# Patient Record
Sex: Male | Born: 1960 | Race: White | Hispanic: No | Marital: Married | Smoking: Former smoker
Health system: Southern US, Community
[De-identification: ages and names within clinical notes are randomized; demographics above are authoritative.]

## PROBLEM LIST (undated history)

## (undated) DIAGNOSIS — E785 Hyperlipidemia, unspecified: Secondary | ICD-10-CM

## (undated) DIAGNOSIS — R918 Other nonspecific abnormal finding of lung field: Secondary | ICD-10-CM

## (undated) DIAGNOSIS — L719 Rosacea, unspecified: Secondary | ICD-10-CM

## (undated) DIAGNOSIS — J439 Emphysema, unspecified: Secondary | ICD-10-CM

## (undated) DIAGNOSIS — T148XXA Other injury of unspecified body region, initial encounter: Secondary | ICD-10-CM

## (undated) DIAGNOSIS — K37 Unspecified appendicitis: Secondary | ICD-10-CM

## (undated) DIAGNOSIS — N400 Enlarged prostate without lower urinary tract symptoms: Secondary | ICD-10-CM

## (undated) DIAGNOSIS — Z972 Presence of dental prosthetic device (complete) (partial): Secondary | ICD-10-CM

## (undated) DIAGNOSIS — K432 Incisional hernia without obstruction or gangrene: Secondary | ICD-10-CM

## (undated) HISTORY — DX: Other nonspecific abnormal finding of lung field: R91.8

## (undated) HISTORY — DX: Unspecified appendicitis: K37

## (undated) HISTORY — DX: Emphysema, unspecified: J43.9

## (undated) HISTORY — DX: Hyperlipidemia, unspecified: E78.5

## (undated) HISTORY — DX: Other injury of unspecified body region, initial encounter: T14.8XXA

## (undated) HISTORY — PX: APPENDECTOMY: SHX54

## (undated) HISTORY — PX: SHOULDER SURGERY: SHX246

## (undated) HISTORY — DX: Benign prostatic hyperplasia without lower urinary tract symptoms: N40.0

## (undated) HISTORY — PX: CARDIAC CATHETERIZATION: SHX172

## (undated) HISTORY — DX: Incisional hernia without obstruction or gangrene: K43.2

---

## 2006-10-17 ENCOUNTER — Other Ambulatory Visit: Payer: Self-pay

## 2006-10-17 ENCOUNTER — Inpatient Hospital Stay: Payer: Self-pay | Admitting: General Surgery

## 2006-11-07 ENCOUNTER — Ambulatory Visit: Payer: Self-pay | Admitting: General Surgery

## 2006-11-10 ENCOUNTER — Ambulatory Visit: Payer: Self-pay | Admitting: General Surgery

## 2006-11-12 ENCOUNTER — Ambulatory Visit: Payer: Self-pay | Admitting: General Surgery

## 2006-11-13 ENCOUNTER — Ambulatory Visit: Payer: Self-pay | Admitting: General Surgery

## 2006-11-17 ENCOUNTER — Ambulatory Visit: Payer: Self-pay | Admitting: General Surgery

## 2006-11-18 ENCOUNTER — Ambulatory Visit: Payer: Self-pay | Admitting: General Surgery

## 2006-11-24 ENCOUNTER — Ambulatory Visit: Payer: Self-pay | Admitting: General Surgery

## 2006-12-01 ENCOUNTER — Encounter: Payer: Self-pay | Admitting: General Surgery

## 2006-12-16 ENCOUNTER — Encounter: Payer: Self-pay | Admitting: General Surgery

## 2007-01-16 ENCOUNTER — Encounter: Payer: Self-pay | Admitting: General Surgery

## 2007-02-14 ENCOUNTER — Encounter: Payer: Self-pay | Admitting: General Surgery

## 2007-03-17 ENCOUNTER — Encounter: Payer: Self-pay | Admitting: General Surgery

## 2007-03-20 IMAGING — XA DG NEPHRO TUBE REMOVAL / FL
1 series · 2 of 2 positions shown · non-contrast
Comparison: none

REASON FOR EXAM: Abscess drainage, tube removal
COMMENTS:

[Series 1: run · 2 of 2 slices shown]
[im 1/2]
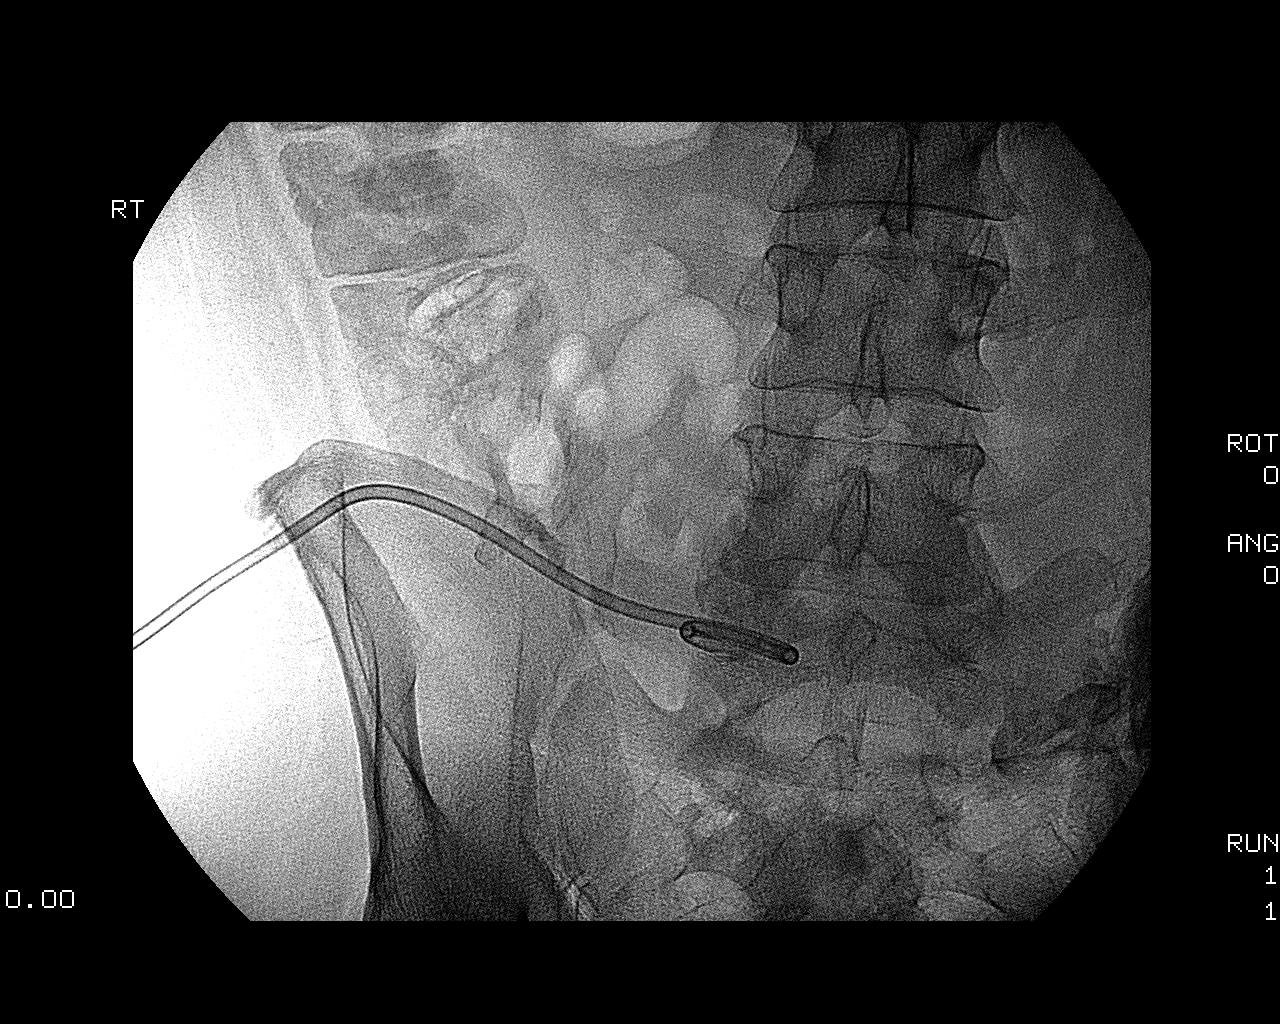
[im 2/2]
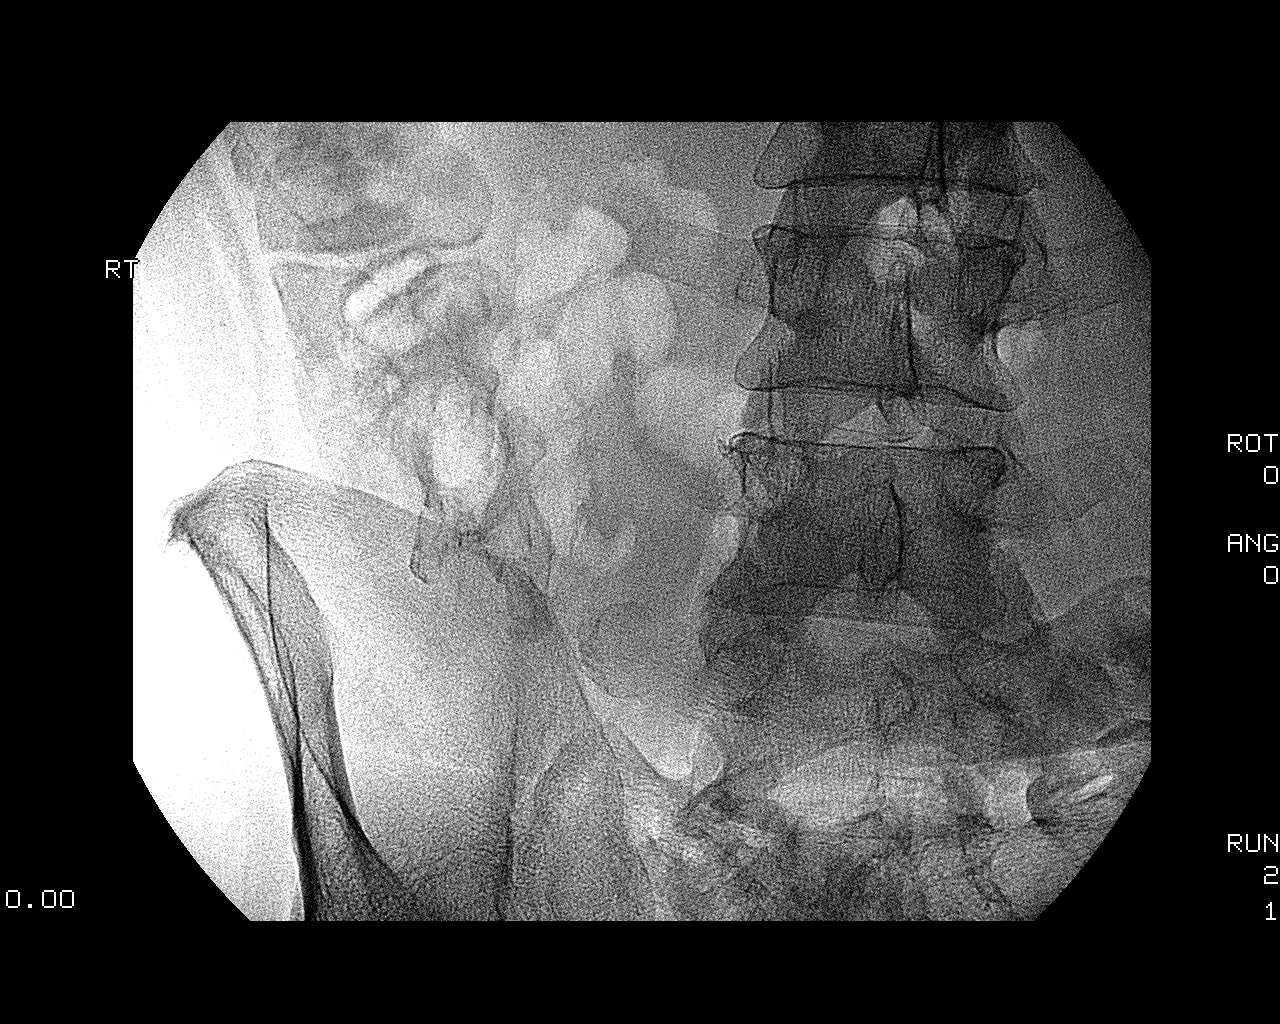

[2 of 2 positions shown; findings below may reference images not displayed]

PROCEDURE:     VAS - TUBE REMOVAL / FLOURO  - November 13, 2006 [DATE]

RESULT:     The patient was brought to the fluoroscopy suite and placed in a
supine position. A RIGHT lower quadrant drainage catheter was appreciated
coped. The entry site for the drainage catheter was prepped and draped in
the usual sterile fashion. The sutures used to secure the catheter were
removed. An Amplatz guidewire was placed through the drainage catheter after
de-coping the catheter tip. The catheter was then removed over the Amplatz
guidewire. The patient tolerated the procedure without complications.
Pressure was held at the entry site without evidence of significant
drainage. The patient was given proper instructions for wound maintenance
and discharged home.
IMPRESSION: Fluoroscopically guided drainage catheter removal as
described above. The patient tolerated the procedure without complications.

## 2007-04-16 ENCOUNTER — Encounter: Payer: Self-pay | Admitting: General Surgery

## 2007-05-17 ENCOUNTER — Encounter: Payer: Self-pay | Admitting: General Surgery

## 2007-05-18 ENCOUNTER — Ambulatory Visit: Payer: Self-pay | Admitting: General Surgery

## 2007-06-16 ENCOUNTER — Encounter: Payer: Self-pay | Admitting: General Surgery

## 2007-12-17 HISTORY — PX: ABDOMINAL HERNIA REPAIR: SHX539

## 2008-06-20 ENCOUNTER — Ambulatory Visit: Payer: Self-pay | Admitting: General Surgery

## 2008-06-28 ENCOUNTER — Other Ambulatory Visit: Payer: Self-pay

## 2008-06-28 ENCOUNTER — Ambulatory Visit: Payer: Self-pay | Admitting: General Surgery

## 2008-06-29 ENCOUNTER — Ambulatory Visit: Payer: Self-pay | Admitting: General Surgery

## 2012-04-20 ENCOUNTER — Ambulatory Visit: Payer: Self-pay

## 2012-04-20 LAB — DOT URINE DIP: Specific Gravity: 1.01 (ref 1.003–1.030)

## 2015-10-03 ENCOUNTER — Telehealth: Payer: Self-pay | Admitting: Gastroenterology

## 2015-10-03 NOTE — Telephone Encounter (Signed)
Colonoscopy triage °

## 2015-10-09 ENCOUNTER — Ambulatory Visit (INDEPENDENT_AMBULATORY_CARE_PROVIDER_SITE_OTHER): Payer: PRIVATE HEALTH INSURANCE | Admitting: Obstetrics and Gynecology

## 2015-10-09 ENCOUNTER — Encounter: Payer: Self-pay | Admitting: *Deleted

## 2015-10-09 VITALS — BP 122/73 | HR 82 | Resp 16 | Ht 71.0 in | Wt 226.8 lb

## 2015-10-09 DIAGNOSIS — R1030 Lower abdominal pain, unspecified: Secondary | ICD-10-CM

## 2015-10-09 DIAGNOSIS — N400 Enlarged prostate without lower urinary tract symptoms: Secondary | ICD-10-CM | POA: Diagnosis not present

## 2015-10-09 LAB — URINALYSIS, COMPLETE
Bilirubin, UA: NEGATIVE
GLUCOSE, UA: NEGATIVE
Ketones, UA: NEGATIVE
Nitrite, UA: NEGATIVE
PROTEIN UA: NEGATIVE
Specific Gravity, UA: >1.03 — ABNORMAL HIGH (ref 1.005–1.030)
Urobilinogen, Ur: 0.2 mg/dL (ref 0.2–1.0)
pH, UA: 5.5 (ref 5.0–7.5)

## 2015-10-09 LAB — BLADDER SCAN AMB NON-IMAGING: Scan Result: 72

## 2015-10-09 LAB — MICROSCOPIC EXAMINATION: RENAL EPITHEL UA: NONE SEEN /HPF

## 2015-10-09 NOTE — Progress Notes (Signed)
10/09/2015 1:49 PM   Cody Matthews 1961-10-18 [id number]  Referring provider: No referring provider defined for this encounter.  Chief Complaint  Patient presents with  . Benign Prostatic Hypertrophy  . Establish Care    HPI: Patient is a 54yo male presenting today with c/o of suprapubic pain x 3 weeks.  He was treated by his PCP with Cipro and tamsulosin. He states that symptoms have improved but not completely resolved. No dysuria, flank pain, gross hematuria or fevers.  History of renal stones. Last episode 6 years ago.  Passed with medical management.  Urinary symptoms include urgency, intermittency, sensation of incomplete bladder emptying, straining to urinate and nocturia twice per night.  09/27/15  PSA 2.84 Cr 1.03  I-PSS- 17 QOL- mixed   PMH: Past Medical History  Diagnosis Date  . Appendicitis   . Hernia, incisional   . Sprain     rotator cuff    Surgical History: Past Surgical History  Procedure Laterality Date  . Appendectomy    . Shoulder surgery Left 2014, 2015  . Abdominal hernia repair  2009    Home Medications:    Medication List       This list is accurate as of: 10/09/15 11:59 PM.  Always use your most recent med list.               ciprofloxacin 500 MG tablet  Commonly known as:  CIPRO  TAKE 1 TABLET BY MOUTH TWICE A DAY FOR 2 WEEKS UNTIL FINISHED     metroNIDAZOLE 0.75 % cream  Commonly known as:  METROCREAM  Apply 1 application topically 2 (two) times daily.     tamsulosin 0.4 MG Caps capsule  Commonly known as:  FLOMAX  Take 0.4 mg by mouth daily.        Allergies:  Allergies  Allergen Reactions  . Codeine Other (See Comments)    HA    Family History: Family History  Problem Relation Age of Onset  . Kidney cancer Neg Hx   . Kidney disease Neg Hx   . Prostate cancer Neg Hx     Social History:  reports that he has been smoking Cigarettes.  He has a 57 pack-year smoking history. He does not have any smokeless  tobacco history on file. He reports that he does not drink alcohol or use illicit drugs.  ROS: UROLOGY Frequent Urination?: Yes Hard to postpone urination?: Yes Burning/pain with urination?: No Get up at night to urinate?: Yes Leakage of urine?: No Urine stream starts and stops?: No Trouble starting stream?: No Do you have to strain to urinate?: No Blood in urine?: No Urinary tract infection?: No Sexually transmitted disease?: No Injury to kidneys or bladder?: No Painful intercourse?: No Weak stream?: No Erection problems?: Yes Penile pain?: No  Gastrointestinal Nausea?: No Vomiting?: No Indigestion/heartburn?: No Diarrhea?: No Constipation?: No  Constitutional Fever: No Night sweats?: No Weight loss?: No Fatigue?: No  Skin Skin rash/lesions?: No Itching?: No  Eyes Blurred vision?: No Double vision?: No  Ears/Nose/Throat Sore throat?: No Sinus problems?: No  Hematologic/Lymphatic Swollen glands?: No Easy bruising?: No  Cardiovascular Leg swelling?: No Chest pain?: No  Respiratory Cough?: No Shortness of breath?: No  Endocrine Excessive thirst?: No  Musculoskeletal Back pain?: No Joint pain?: No  Neurological Headaches?: No Dizziness?: No  Psychologic Depression?: No Anxiety?: No  Physical Exam: BP 122/73 mmHg  Pulse 82  Resp 16  Ht 5\' 11"  (1.803 m)  Wt 226 lb 12.8 oz (102.876  kg)  BMI 31.65 kg/m2  Constitutional:  Alert and oriented, No acute distress. HEENT: [place] AT, moist mucus membranes.  Trachea midline, no masses. Cardiovascular: No clubbing, cyanosis, or edema. Respiratory: Normal respiratory effort, no increased work of breathing. GI: Abdomen is soft, nontender, nondistended, no abdominal masses GU: No CVA tenderness, uncircumcised phallus, testicles descended bilaterally DRE: prostate normal, nontender, no nodules Skin: No rashes, bruises or suspicious lesions. Lymph: No cervical or inguinal adenopathy. Neurologic: Grossly  intact, no focal deficits, moving all 4 extremities. Psychiatric: Normal mood and affect.  Laboratory Data:   Urinalysis  Pertinent Imaging:  Assessment & Plan:    1. BPH (benign prostatic hyperplasia)-  PVR 27ml. UA unremarkable.  Patient would like to continue Cipro/ Flomax and follow up if symptoms do not resolved. - Urinalysis, Complete  2. H/O Renal Calculi- we discussed that possibly recurrent renal stones could be a source of patient's suprapubic pain. He declined any further workup at this time and states he'll will call to schedule a follow-up appointment if his pain does not resolve.  Return if symptoms worsen or fail to improve.  These notes generated with voice recognition software. I apologize for typographical errors.  Herbert Moors, [facility] [address], [place], [place] [address] [phone]

## 2015-10-13 ENCOUNTER — Telehealth: Payer: Self-pay

## 2015-10-13 ENCOUNTER — Other Ambulatory Visit: Payer: Self-pay

## 2015-10-13 ENCOUNTER — Encounter: Payer: Self-pay | Admitting: *Deleted

## 2015-10-13 NOTE — Telephone Encounter (Signed)
Pt scheduled at [facility] on 10/19/15. Instructs/rx mailed.

## 2015-10-13 NOTE — Telephone Encounter (Signed)
Gastroenterology Pre-Procedure Review  Request Date: 10/19/15 Requesting Physician:   PATIENT REVIEW QUESTIONS: The patient responded to the following health history questions as indicated:    1. Are you having any GI issues? no 2. Do you have a personal history of Polyps? no 3. Do you have a family history of Colon Cancer or Polyps? no 4. Diabetes Mellitus? no 5. Joint replacements in the past 12 months?no 6. Major health problems in the past 3 months?no 7. Any artificial heart valves, MVP, or defibrillator?no    MEDICATIONS & ALLERGIES:    Patient reports the following regarding taking any anticoagulation/antiplatelet therapy:   Plavix, Coumadin, Eliquis, Xarelto, Lovenox, Pradaxa, Brilinta, or Effient? no Aspirin? no  Patient confirms/reports the following medications:  Current Outpatient Prescriptions  Medication Sig Dispense Refill  . ciprofloxacin (CIPRO) 500 MG tablet TAKE 1 TABLET BY MOUTH TWICE A DAY FOR 2 WEEKS UNTIL FINISHED  0  . metroNIDAZOLE (METROCREAM) 0.75 % cream Apply 1 application topically 2 (two) times daily.    . tamsulosin (FLOMAX) 0.4 MG CAPS capsule Take 0.4 mg by mouth daily.  5   No current facility-administered medications for this visit.    Patient confirms/reports the following allergies:  Allergies  Allergen Reactions  . Codeine Other (See Comments)    HA    No orders of the defined types were placed in this encounter.    AUTHORIZATION INFORMATION Primary Insurance: 1D#: Group #:  Secondary Insurance: 1D#: Group #:  SCHEDULE INFORMATION: Date: 10/19/15 Time: Location: [place]

## 2015-10-16 ENCOUNTER — Encounter: Payer: Self-pay | Admitting: Anesthesiology

## 2015-10-17 NOTE — Discharge Instructions (Signed)

## 2015-10-18 ENCOUNTER — Other Ambulatory Visit: Payer: Self-pay

## 2015-10-19 ENCOUNTER — Encounter
Admission: RE | Disposition: A | Discharge status: Home / Self Care | Payer: Self-pay | Source: Ambulatory Visit | Attending: Gastroenterology

## 2015-10-19 ENCOUNTER — Ambulatory Visit: Payer: PRIVATE HEALTH INSURANCE | Admitting: Anesthesiology

## 2015-10-19 ENCOUNTER — Other Ambulatory Visit: Payer: Self-pay | Admitting: Gastroenterology

## 2015-10-19 ENCOUNTER — Ambulatory Visit
Admission: RE | Admit: 2015-10-19 | Discharge: 2015-10-19 | Disposition: A | Discharge status: Home / Self Care | Payer: PRIVATE HEALTH INSURANCE | Source: Ambulatory Visit | Attending: Gastroenterology | Admitting: Gastroenterology

## 2015-10-19 DIAGNOSIS — Z880 Allergy status to penicillin: Secondary | ICD-10-CM | POA: Diagnosis not present

## 2015-10-19 DIAGNOSIS — D124 Benign neoplasm of descending colon: Secondary | ICD-10-CM | POA: Insufficient documentation

## 2015-10-19 DIAGNOSIS — D125 Benign neoplasm of sigmoid colon: Secondary | ICD-10-CM | POA: Insufficient documentation

## 2015-10-19 DIAGNOSIS — F17210 Nicotine dependence, cigarettes, uncomplicated: Secondary | ICD-10-CM | POA: Diagnosis not present

## 2015-10-19 DIAGNOSIS — Z885 Allergy status to narcotic agent status: Secondary | ICD-10-CM | POA: Insufficient documentation

## 2015-10-19 DIAGNOSIS — Z79899 Other long term (current) drug therapy: Secondary | ICD-10-CM | POA: Insufficient documentation

## 2015-10-19 DIAGNOSIS — L719 Rosacea, unspecified: Secondary | ICD-10-CM | POA: Diagnosis not present

## 2015-10-19 DIAGNOSIS — Z1211 Encounter for screening for malignant neoplasm of colon: Secondary | ICD-10-CM | POA: Insufficient documentation

## 2015-10-19 HISTORY — PX: COLONOSCOPY WITH PROPOFOL: SHX5780

## 2015-10-19 HISTORY — DX: Presence of dental prosthetic device (complete) (partial): Z97.2

## 2015-10-19 HISTORY — DX: Rosacea, unspecified: L71.9

## 2015-10-19 HISTORY — PX: POLYPECTOMY: SHX5525

## 2015-10-19 SURGERY — COLONOSCOPY WITH PROPOFOL
Anesthesia: Monitor Anesthesia Care | Wound class: Contaminated

## 2015-10-19 MED ORDER — OXYCODONE HCL 5 MG/5ML PO SOLN: 5.0000 mg | Freq: Once | ORAL | Status: DC | PRN

## 2015-10-19 MED ORDER — PROMETHAZINE HCL 25 MG/ML IJ SOLN: 6.2500 mg | INTRAMUSCULAR | Status: DC | PRN

## 2015-10-19 MED ORDER — SODIUM CHLORIDE 0.9 % IV SOLN: INTRAVENOUS | Status: DC

## 2015-10-19 MED ORDER — LACTATED RINGERS IV SOLN
INTRAVENOUS | Status: DC
  Administered 2015-10-19: 09:00:00 via INTRAVENOUS

## 2015-10-19 MED ORDER — OXYCODONE HCL 5 MG PO TABS: 5.0000 mg | ORAL_TABLET | Freq: Once | ORAL | Status: DC | PRN

## 2015-10-19 MED ORDER — PROPOFOL 10 MG/ML IV BOLUS
INTRAVENOUS | Status: DC | PRN
  Administered 2015-10-19: 100 mg via INTRAVENOUS

## 2015-10-19 MED ORDER — LIDOCAINE HCL (CARDIAC) 20 MG/ML IV SOLN
INTRAVENOUS | Status: DC | PRN
  Administered 2015-10-19: 50 mg via INTRAVENOUS

## 2015-10-19 MED ORDER — STERILE WATER FOR IRRIGATION IR SOLN
Status: DC | PRN
  Administered 2015-10-19: 10:00:00

## 2015-10-19 MED ORDER — HYDROMORPHONE HCL 1 MG/ML IJ SOLN: 0.2500 mg | INTRAMUSCULAR | Status: DC | PRN

## 2015-10-19 MED ORDER — MEPERIDINE HCL 25 MG/ML IJ SOLN: 6.2500 mg | INTRAMUSCULAR | Status: DC | PRN

## 2015-10-19 SURGICAL SUPPLY — 28 items

## 2015-10-19 NOTE — Anesthesia Procedure Notes (Signed)
Procedure Name: MAC Performed by: Jataya Wann Pre-anesthesia Checklist: Patient identified, Emergency Drugs available, Suction available, Timeout performed and Patient being monitored Patient Re-evaluated:Patient Re-evaluated prior to inductionOxygen Delivery Method: Nasal cannula Placement Confirmation: positive ETCO2     

## 2015-10-19 NOTE — Anesthesia Postprocedure Evaluation (Signed)
  Anesthesia Post-op Note  Patient: Cody Matthews  Procedure(s) Performed: Procedure(s): COLONOSCOPY WITH PROPOFOL (N/A)  Anesthesia type:MAC  Patient location: PACU  Post pain: Pain level controlled  Post assessment: Post-op Vital signs reviewed, Patient's Cardiovascular Status Stable, Respiratory Function Stable, Patent Airway and No signs of Nausea or vomiting  Post vital signs: Reviewed and stable  Last Vitals:  Filed Vitals:   10/19/15 1020  BP:   Pulse: 73  Temp: 36.6 C  Resp: 17    Level of consciousness: awake, alert  and patient cooperative  Complications: No apparent anesthesia complications

## 2015-10-19 NOTE — Transfer of Care (Signed)
Immediate Anesthesia Transfer of Care Note  Patient: Cody Matthews  Procedure(s) Performed: Procedure(s): COLONOSCOPY WITH PROPOFOL (N/A)  Patient Location: PACU  Anesthesia Type: MAC  Level of Consciousness: awake, alert  and patient cooperative  Airway and Oxygen Therapy: Patient Spontanous Breathing and Patient connected to supplemental oxygen  Post-op Assessment: Post-op Vital signs reviewed, Patient's Cardiovascular Status Stable, Respiratory Function Stable, Patent Airway and No signs of Nausea or vomiting  Post-op Vital Signs: Reviewed and stable  Complications: No apparent anesthesia complications

## 2015-10-19 NOTE — Op Note (Signed)
[facility] Gastroenterology Patient Name: Cody Matthews Procedure Date: 10/19/2015 9:59 AM MRN: [id number] Account #: [id number] Date of Birth: 06-19-1961 Admit Type: Outpatient Age: 54 Room: [facility] OR ROOM 01 Gender: Male Note Status: Finalized Procedure:         Colonoscopy Indications:       Screening for colorectal malignant neoplasm Providers:         Lucilla Lame, MD Referring MD:      Cletis Athens, MD (Referring MD) Medicines:         Propofol per Anesthesia Complications:     No immediate complications. Procedure:         Pre-Anesthesia Assessment:                    - Prior to the procedure, a History and Physical was                     performed, and patient medications and allergies were                     reviewed. The patient's tolerance of previous anesthesia                     was also reviewed. The risks and benefits of the procedure                     and the sedation options and risks were discussed with the                     patient. All questions were answered, and informed consent                     was obtained. Prior Anticoagulants: The patient has taken                     no previous anticoagulant or antiplatelet agents. ASA                     Grade Assessment: II - A patient with mild systemic                     disease. After reviewing the risks and benefits, the                     patient was deemed in satisfactory condition to undergo                     the procedure.                    After obtaining informed consent, the colonoscope was                     passed under direct vision. Throughout the procedure, the                     patient's blood pressure, pulse, and oxygen saturations                     were monitored continuously. The was introduced through                     the anus and advanced to the the cecum, identified by  appendiceal orifice and ileocecal valve. The colonoscopy          was performed without difficulty. The patient tolerated                     the procedure well. The quality of the bowel preparation                     was excellent. Findings:      The perianal and digital rectal examinations were normal.      Three sessile polyps were found in the descending colon. The polyps were       4 to 6 mm in size. These polyps were removed with a cold snare.       Resection and retrieval were complete.      Three sessile polyps were found in the sigmoid colon. The polyps were 2       to 3 mm in size. These polyps were removed with a cold biopsy forceps.       Resection and retrieval were complete. Impression:        - Three 4 to 6 mm polyps in the descending colon. Resected                     and retrieved.                    - Three 2 to 3 mm polyps in the sigmoid colon. Resected                     and retrieved. Recommendation:    - Await pathology results.                    - Repeat colonoscopy in 5 years if polyp adenoma and 10                     years if hyperplastic Procedure Code(s): --- Professional ---                    [phone], Colonoscopy, flexible; with removal of tumor(s),                     polyp(s), or other lesion(s) by snare technique                    45380, 36, Colonoscopy, flexible; with biopsy, single or                     multiple Diagnosis Code(s): --- Professional ---                    Z12.11, Encounter for screening for malignant neoplasm of                     colon                    D12.4, Benign neoplasm of descending colon                    D12.5, Benign neoplasm of sigmoid colon CPT copyright 2014 American Medical Association. All rights reserved. The codes documented in this report are preliminary and upon coder review may  be revised to meet current compliance requirements. Lucilla Lame, MD 10/19/2015 10:16:42 AM This report has been signed electronically. Number of Addenda: 0 Note Initiated On: 10/19/2015  9:59 AM Scope Withdrawal Time: 0 hours 6 minutes 24 seconds  Total Procedure Duration: 0 hours 11 minutes 8 seconds       [facility]

## 2015-10-19 NOTE — Anesthesia Preprocedure Evaluation (Signed)
Anesthesia Evaluation  Patient identified by MRN, date of birth, ID band Patient awake    Reviewed: Allergy & Precautions, NPO status , Patient's Chart, lab work & pertinent test results, reviewed documented beta blocker date and time   Airway Mallampati: II  TM Distance: >3 FB Neck ROM: Full    Dental  (+) Upper Dentures, Lower Dentures   Pulmonary Current Smoker,           Cardiovascular negative cardio ROS       Neuro/Psych negative neurological ROS  negative psych ROS   GI/Hepatic negative GI ROS, Neg liver ROS,   Endo/Other  negative endocrine ROS  Renal/GU negative Renal ROS  negative genitourinary   Musculoskeletal negative musculoskeletal ROS (+)   Abdominal   Peds negative pediatric ROS (+)  Hematology negative hematology ROS (+)   Anesthesia Other Findings   Reproductive/Obstetrics negative OB ROS                             Anesthesia Physical Anesthesia Plan  ASA: II  Anesthesia Plan: MAC   Post-op Pain Management:    Induction:   Airway Management Planned:   Additional Equipment:   Intra-op Plan:   Post-operative Plan:   Informed Consent: I have reviewed the patients History and Physical, chart, labs and discussed the procedure including the risks, benefits and alternatives for the proposed anesthesia with the patient or authorized representative who has indicated his/her understanding and acceptance.     Plan Discussed with: CRNA  Anesthesia Plan Comments:         Anesthesia Quick Evaluation

## 2015-10-19 NOTE — H&P (Signed)
  [facility]  [address] [place], [place] [address] Phone: [phone] Fax : [phone]  Primary Care Physician:  Cletis Athens, MD Primary Gastroenterologist:  Dr. Allen Norris  Pre-Procedure History & Physical: HPI:  [name] is a 54 y.o. male is here for a screening colonoscopy.   Past Medical History  Diagnosis Date  . Appendicitis   . Hernia, incisional   . Sprain     rotator cuff  . Rosacea   . Wears dentures     full upper and lower    Past Surgical History  Procedure Laterality Date  . Appendectomy    . Shoulder surgery Left 2014, 2015  . Abdominal hernia repair  2009  . Cardiac catheterization  approx 2000    no issues    Prior to Admission medications   Medication Sig Start Date End Date Taking? Authorizing Provider  metroNIDAZOLE (METROCREAM) 0.75 % cream Apply 1 application topically 2 (two) times daily.   Yes Historical Provider, MD  tamsulosin (FLOMAX) 0.4 MG CAPS capsule Take 0.4 mg by mouth daily. 09/22/15  Yes Historical Provider, MD  ciprofloxacin (CIPRO) 500 MG tablet TAKE 1 TABLET BY MOUTH TWICE A DAY FOR 2 WEEKS UNTIL FINISHED 09/22/15   Historical Provider, MD  metroNIDAZOLE (METROGEL) 1 % gel APPLY TO AFFECTED AREA TWICE A DAY 10/02/15   Historical Provider, MD    Allergies as of 10/13/2015 - Review Complete 10/13/2015  Allergen Reaction Noted  . Codeine Other (See Comments) 10/09/2015  . Penicillins Other (See Comments) 10/13/2015    Family History  Problem Relation Age of Onset  . Kidney cancer Neg Hx   . Kidney disease Neg Hx   . Prostate cancer Neg Hx     Social History   Social History  . Marital Status: Married    Spouse Name: N/A  . Number of Children: N/A  . Years of Education: N/A   Occupational History  . Not on file.   Social History Main Topics  . Smoking status: Current Every Day Smoker -- 1.50 packs/day for 38 years    Types: Cigarettes  . Smokeless tobacco: Not on file  . Alcohol Use: No    . Drug Use: No  . Sexual Activity: Not on file   Other Topics Concern  . Not on file   Social History Narrative    Review of Systems: See HPI, otherwise negative ROS  Physical Exam: BP 118/73 mmHg  Pulse 71  Temp(Src) 98.2 F (36.8 C)  Ht 5\' 11"  (1.803 m)  Wt 218 lb (98.884 kg)  BMI 30.42 kg/m2  SpO2 94% General:   Alert,  pleasant and cooperative in NAD Head:  Normocephalic and atraumatic. Neck:  Supple; no masses or thyromegaly. Lungs:  Clear throughout to auscultation.    Heart:  Regular rate and rhythm. Abdomen:  Soft, nontender and nondistended. Normal bowel sounds, without guarding, and without rebound.   Neurologic:  Alert and  oriented x4;  grossly normal neurologically.  Impression/Plan: Cody Matthews is now here to undergo a screening colonoscopy.  Risks, benefits, and alternatives regarding colonoscopy have been reviewed with the patient.  Questions have been answered.  All parties agreeable.

## 2015-10-20 ENCOUNTER — Encounter: Payer: Self-pay | Admitting: Gastroenterology

## 2015-10-21 ENCOUNTER — Encounter: Payer: Self-pay | Admitting: Gastroenterology

## 2016-07-23 ENCOUNTER — Other Ambulatory Visit: Payer: Self-pay

## 2016-07-30 ENCOUNTER — Telehealth: Payer: Self-pay | Admitting: *Deleted

## 2016-07-30 NOTE — Telephone Encounter (Signed)
Received referral for low dose lung cancer screening CT scan. Voicemail left at phone number listed in EMR for patient to call me back to facilitate scheduling scan.  

## 2016-07-30 NOTE — Telephone Encounter (Signed)
Received referral for initial lung cancer screening scan. Contacted patient and obtained smoking history,(current 57pack year) as well as answering questions related to screening process. Patient denies signs of lung cancer such as weight loss or hemoptysis. Patient denies comorbidity that would prevent curative treatment if lung cancer were found. Patient is tentatively scheduled for shared decision making visit and CT scan on 08/06/16 at 1:30pm, pending insurance approval from business office.

## 2016-08-02 ENCOUNTER — Other Ambulatory Visit: Payer: Self-pay | Admitting: *Deleted

## 2016-08-02 DIAGNOSIS — Z87891 Personal history of nicotine dependence: Secondary | ICD-10-CM

## 2016-08-06 ENCOUNTER — Inpatient Hospital Stay: Payer: PRIVATE HEALTH INSURANCE | Attending: Oncology | Admitting: Oncology

## 2016-08-06 ENCOUNTER — Ambulatory Visit
Admission: RE | Admit: 2016-08-06 | Discharge: 2016-08-06 | Disposition: A | Discharge status: Home / Self Care | Payer: No Typology Code available for payment source | Source: Ambulatory Visit | Attending: Oncology | Admitting: Oncology

## 2016-08-06 ENCOUNTER — Encounter (INDEPENDENT_AMBULATORY_CARE_PROVIDER_SITE_OTHER): Payer: Self-pay

## 2016-08-06 DIAGNOSIS — R918 Other nonspecific abnormal finding of lung field: Secondary | ICD-10-CM | POA: Diagnosis not present

## 2016-08-06 DIAGNOSIS — Z87891 Personal history of nicotine dependence: Secondary | ICD-10-CM | POA: Insufficient documentation

## 2016-08-06 DIAGNOSIS — Z122 Encounter for screening for malignant neoplasm of respiratory organs: Secondary | ICD-10-CM | POA: Diagnosis not present

## 2016-08-06 DIAGNOSIS — I2510 Atherosclerotic heart disease of native coronary artery without angina pectoris: Secondary | ICD-10-CM | POA: Diagnosis not present

## 2016-08-08 ENCOUNTER — Telehealth: Payer: Self-pay | Admitting: *Deleted

## 2016-08-08 NOTE — Telephone Encounter (Signed)
Attempted to notify patient of LDCT lung cancer screening results with recommendation for 6 month follow up imaging. Also will be notified of incidental finding noted below, and instructed to discuss with referring provider to assess for needed further evaluation or intervention. Voicemail left for patient. This note will be forwarded to referring provider.  IMPRESSION: 1. Lung-Rads category 3S, probably benign findings. Short-term follow-up in 6 months is recommended with repeat low-dose chest CT without contrast (please use the following order, "CT CHEST LCS NODULE FOLLOW-UP W/O CM"). A right lower lobe nodular density is subpleural position and could represent a area of volume loss/ atelectasis. 2. The "S" modifier represents a potentially clinically significant non pulmonary finding. Age advanced coronary artery atherosclerosis, including calcified plaque in the left main. Correlate with risk factors, consider medical therapy, and possibly cardiology consultation.

## 2016-08-12 DIAGNOSIS — Z87891 Personal history of nicotine dependence: Secondary | ICD-10-CM | POA: Insufficient documentation

## 2016-08-12 NOTE — Progress Notes (Signed)
In accordance with CMS guidelines, patient has met eligibility criteria including age, absence of signs or symptoms of lung cancer.  Social History  Substance Use Topics  . Smoking status: Current Every Day Smoker    Packs/day: 1.50    Years: 38.00    Types: Cigarettes  . Smokeless tobacco: Not on file  . Alcohol use No     A shared decision-making session was conducted prior to the performance of CT scan. This includes one or more decision aids, includes benefits and harms of screening, follow-up diagnostic testing, over-diagnosis, false positive rate, and total radiation exposure.  Counseling on the importance of adherence to annual lung cancer LDCT screening, impact of co-morbidities, and ability or willingness to undergo diagnosis and treatment is imperative for compliance of the program.  Counseling on the importance of continued smoking cessation for former smokers; the importance of smoking cessation for current smokers, and information about tobacco cessation interventions have been given to patient including [place] and [facility] quit [place] programs.  Written order for lung cancer screening with LDCT has been given to the patient and any and all questions have been answered to the best of my abilities.   Yearly follow up will be coordinated by Burgess Estelle, Thoracic Navigator.

## 2016-11-05 ENCOUNTER — Ambulatory Visit: Payer: No Typology Code available for payment source | Admitting: Cardiology

## 2016-11-13 ENCOUNTER — Encounter: Payer: Self-pay | Admitting: Internal Medicine

## 2016-11-13 ENCOUNTER — Encounter (INDEPENDENT_AMBULATORY_CARE_PROVIDER_SITE_OTHER): Payer: Self-pay

## 2016-11-13 ENCOUNTER — Ambulatory Visit (INDEPENDENT_AMBULATORY_CARE_PROVIDER_SITE_OTHER): Payer: 59 | Admitting: Internal Medicine

## 2016-11-13 VITALS — BP 124/72 | HR 61 | Ht 69.5 in | Wt 239.5 lb

## 2016-11-13 DIAGNOSIS — I208 Other forms of angina pectoris: Secondary | ICD-10-CM

## 2016-11-13 DIAGNOSIS — R0602 Shortness of breath: Secondary | ICD-10-CM | POA: Diagnosis not present

## 2016-11-13 MED ORDER — ISOSORBIDE MONONITRATE ER 30 MG PO TB24: 30.0000 mg | ORAL_TABLET | Freq: Every day | ORAL | 3 refills | Status: DC

## 2016-11-13 MED ORDER — NITROGLYCERIN 0.4 MG SL SUBL: 0.4000 mg | SUBLINGUAL_TABLET | SUBLINGUAL | 5 refills | Status: DC | PRN

## 2016-11-13 NOTE — Patient Instructions (Addendum)
Medication Instructions:  Your physician has recommended you make the following change in your medication:  1- START Imdur 30 mg (1 tablet) by mouth once a day. 2- Nitroglycerin 0.4 mg (1 tablet) by mouth every 5 minutes as needed, up to 3 times.   The proper use and anticipated side effects of nitroglycerine has been carefully explained.  If a single episode of chest pain is not relieved by one tablet, the patient will try another within 5 minutes; and for a 3rd time after 5 minutes and if this doesn't relieve the pain, the patient is instructed to call 911 for transportation to an emergency department.   Follow-Up: Your physician recommends that you schedule a follow-up appointment in: 3 MONTHS WITH DR END.  We will request medical records from Dr [facility] office for Dr End to review.

## 2016-11-13 NOTE — Progress Notes (Signed)
New Outpatient Visit Date: 11/13/2016  Referring Provider: Cletis Athens, MD [address] [place], [place] [address]  Chief Complaint: Chest discomfort and abnormal stress test  HPI:  Cody Matthews is a 55 y.o. year-old male with history of COPD, tobacco use, and BPH, who has been referred by Dr. Lavera Guise for evaluation of chest discomfort, shortness of breath, and abnormal stress test. The patient has noticed a vague tightness across his chest with a maximal intensity of 2/10 over the last 4 months. This occurs only when he walks up an incline and resolves promptly within 5 minutes. He notes accompanying shortness of breath but no other symptoms. He has not had any pain or dyspnea at rest. He reports occasional cramping in both legs, beginning in the calves and radiating up to the thighs. This typically happens when he has been sitting still for long time or lying in bed. He denies lower extremity edema as well as orthopnea and PND.  The patient was recently evaluated by Dr. Lavera Guise, who reportedly performed an echocardiogram and myocardial perfusion stress test. The patient states that an abnormality was identified on the stress test, though the records are not available for review today. Cody Matthews reports that cardiac catheterization was recommended.  --------------------------------------------------------------------------------------------------  Cardiovascular History & Procedures: Cardiovascular Problems:  Stable angina with abnormal stress test  Risk Factors:  Tobacco use, male gender, and age > 72  Cath/PCI:  None  CV Surgery:  None  EP Procedures and Devices:  None  Non-Invasive Evaluation(s):  TTE: Reportedly normal per the patient.  Stress test: Reportedly abnormal per patient, but images and report are not available for review.  Recent CV Pertinent Labs: Not  available.  --------------------------------------------------------------------------------------------------  Past Medical History:  Diagnosis Date  . Appendicitis   . BPH (benign prostatic hyperplasia)   . Emphysema lung ([facility])   . Hernia, incisional   . Hyperlipidemia   . Lung nodules   . Rosacea   . Sprain    rotator cuff  . Wears dentures    full upper and lower    Past Surgical History:  Procedure Laterality Date  . ABDOMINAL HERNIA REPAIR  2009  . APPENDECTOMY    . CARDIAC CATHETERIZATION  approx 2000   no issues  . COLONOSCOPY WITH PROPOFOL N/A 10/19/2015   Procedure: COLONOSCOPY WITH PROPOFOL;  Surgeon: Lucilla Lame, MD;  Location: [facility];  Service: Endoscopy;  Laterality: N/A;  . POLYPECTOMY  10/19/2015   Procedure: POLYPECTOMY;  Surgeon: Lucilla Lame, MD;  Location: [facility];  Service: Endoscopy;;  . SHOULDER SURGERY Left 2014, 2015    Outpatient Encounter Prescriptions as of 11/13/2016  Medication Sig  . aspirin 81 MG tablet Take 81 mg by mouth daily.  Marland Kitchen atorvastatin (LIPITOR) 80 MG tablet Take by mouth daily at 6 PM.   . buPROPion (WELLBUTRIN XL) 150 MG 24 hr tablet Take 150 mg by mouth.   . metoprolol succinate (TOPROL-XL) 100 MG 24 hr tablet Take 100 mg by mouth.   . metroNIDAZOLE (METROCREAM) 0.75 % cream Apply 1 application topically 2 (two) times daily.  . metroNIDAZOLE (METROGEL) 1 % gel APPLY TO AFFECTED AREA TWICE A DAY  . tamsulosin (FLOMAX) 0.4 MG CAPS capsule Take 0.4 mg by mouth daily.  . [DISCONTINUED] ciprofloxacin (CIPRO) 500 MG tablet TAKE 1 TABLET BY MOUTH TWICE A DAY FOR 2 WEEKS UNTIL FINISHED   No facility-administered encounter medications on file as of 11/13/2016.     Allergies: Codeine and Penicillins  Social History   Social History  . Marital status: Married    Spouse name: N/A  . Number of children: N/A  . Years of education: N/A   Occupational History  . Not on file.   Social History Main Topics  .  Smoking status: Current Every Day Smoker    Packs/day: 1.00    Years: 38.00    Types: Cigarettes  . Smokeless tobacco: Never Used  . Alcohol use 0.0 oz/week     Comment: Drinks 1 beer every few weeks to month  . Drug use: No  . Sexual activity: Not on file   Other Topics Concern  . Not on file   Social History Narrative  . No narrative on file    Family History  Problem Relation Age of Onset  . Breast cancer Mother   . Emphysema Father   . Heart disease Father   . Heart attack Maternal Grandfather 62  . Stroke Paternal Grandmother   . Heart disease Paternal Grandfather     Pacemaker  . Kidney cancer Neg Hx   . Kidney disease Neg Hx   . Prostate cancer Neg Hx     Review of Systems: A 12-system review of systems was performed and was negative except as noted in the HPI.  --------------------------------------------------------------------------------------------------  Physical Exam: BP 124/72 (BP Location: Right Arm, Patient Position: Sitting, Cuff Size: Normal)   Pulse 61   Ht 5' 9.5" (1.765 m)   Wt 239 lb 8 oz (108.6 kg)   BMI 34.86 kg/m   General:  Obese man seated comfortably in the exam room. He smells of tobacco smoke. HEENT: No conjunctival pallor or scleral icterus.  Moist mucous membranes.  OP clear. Neck: Supple without lymphadenopathy, thyromegaly, JVD, or HJR. Lungs: Normal work of breathing.  Clear to auscultation bilaterally without wheezes or crackles. Heart: Regular rate and rhythm without murmurs, rubs, or gallops.  Non-displaced PMI. Abd: Bowel sounds present.  Soft, NT/ND without hepatosplenomegaly Ext: No lower extremity edema.  Radial, PT, and DP pulses are 2+ bilaterally Skin: warm and dry without rash Neuro: CNIII-XII intact.  Strength and fine-touch sensation intact in upper and lower extremities bilaterally. Psych: Normal mood and affect.  EKG:  Normal sinus rhythm with 1st degree AV block (PR interval 218 ms). No significant change from  prior tracing on 06/28/08 (I have personally reviewed both tracings).  --------------------------------------------------------------------------------------------------  ASSESSMENT AND PLAN: Stable angina The patient's exertional chest tightness and shortness of breath are consistent with stable angina. Unfortunately, his outside records are not available for review today. We have agreed to continue with his current doses of aspirin, metoprolol, and atorvastatin. We will add isosorbide mononitrate 30 mg daily to see if this helps improve his symptoms. We will request records from Dr. Jennette Kettle office. If there is evidence of intermediate/high risk ischemia or if the patient's symptoms do not resolve with antianginal therapy, we will proceed with cardiac catheterization.  Shortness of breath This is likely multifactorial. I have encouraged the patient to continue cutting back on his tobacco use and ultimately quitting.  Follow-up: Return to clinic in 3 months.  Nelva Bush, MD 11/13/2016 3:17 PM

## 2016-11-19 ENCOUNTER — Telehealth: Payer: Self-pay | Admitting: Internal Medicine

## 2016-11-19 DIAGNOSIS — I208 Other forms of angina pectoris: Secondary | ICD-10-CM

## 2016-11-19 NOTE — Telephone Encounter (Signed)
I have reviewed the outside records from Dr. Lavera Guise.  Stress test on 10/28/16 was notable for exercise duration of 8 minutes with peak heart rate of 114 bpm.  No significant ST segment changes were noted.  There are conflicting reports of chest pain during the study.  Since our visit last week with addition of isosorbide mononitrate, the patient has not had any further episodes of exertional chest pain.  However, given his symptoms prior to initiation of medical therapy and cardiovascular risk factors in the setting of a non-diagnostic ETT, we have agreed to proceed with a pharmacologic myocardial perfusion stress test for further risk stratification.  Nelva Bush, MD [facility] Pager: [phone]

## 2016-11-22 ENCOUNTER — Telehealth: Payer: Self-pay | Admitting: *Deleted

## 2016-11-22 DIAGNOSIS — R079 Chest pain, unspecified: Secondary | ICD-10-CM

## 2016-11-22 NOTE — Telephone Encounter (Signed)
Pt returning our call, states best time is in afternoon 2-4 pm  Please call back

## 2016-11-22 NOTE — Telephone Encounter (Signed)
No answer. Left message to call back to schedule nuclear stress test.

## 2016-11-25 NOTE — Telephone Encounter (Signed)
No answer. Left message to call back on home and cell phone.

## 2016-11-26 NOTE — Telephone Encounter (Signed)
Cell phone number rang but no answering machine picked up. Will try again later.

## 2016-11-27 ENCOUNTER — Encounter: Payer: Self-pay | Admitting: *Deleted

## 2016-11-27 NOTE — Telephone Encounter (Signed)
S/w patient. Patient agreeable to nuclear myoview. Scheduled patient for 12/23/16 at 0730am. Instructions and Information given to patient as follows.  Letter with information mailed to patient. Patient also wrote down the instructions over the phone.  [place]  Your caregiver has ordered a Stress Test with nuclear imaging. The purpose of this test is to evaluate the blood supply to your heart muscle. This procedure is referred to as a "Non-Invasive Stress Test." This is because other than having an IV started in your vein, nothing is inserted or "invades" your body. Cardiac stress tests are done to find areas of poor blood flow to the heart by determining the extent of coronary artery disease (CAD). Some patients exercise on a treadmill, which naturally increases the blood flow to your heart, while others who are  unable to walk on a treadmill due to physical limitations have a pharmacologic/chemical stress agent called Lexiscan . This medicine will mimic walking on a treadmill by temporarily increasing your coronary blood flow.   Please note: these test may take anywhere between 2-4 hours to complete  PLEASE REPORT TO [facility] AT THE FIRST DESK WILL DIRECT YOU WHERE TO GO  Date of Procedure:____1/8/18_________  Arrival Time for Procedure:______07:15  Instructions regarding medication:   _yes__:  Hold Metoprolol night before procedure and morning of procedure.  PLEASE NOTIFY THE OFFICE AT LEAST 91 HOURS IN ADVANCE IF YOU ARE UNABLE TO KEEP YOUR APPOINTMENT.  [phone] AND  PLEASE NOTIFY NUCLEAR MEDICINE AT [facility] AT LEAST 24 HOURS IN ADVANCE IF YOU ARE UNABLE TO KEEP YOUR APPOINTMENT. [phone]  How to prepare for your Myoview test:  1. Do not eat or drink after midnight 2. No caffeine for 24 hours prior to test 3. No smoking 24 hours prior to test. 4. Your medication may be taken with water.  If your doctor stopped a medication because of this  test, do not take that medication. 5. Ladies, please do not wear dresses.  Skirts or pants are appropriate. Please wear a short sleeve shirt. 6. No perfume, cologne or lotion. 7. Wear comfortable walking shoes. No heels!

## 2016-12-18 ENCOUNTER — Telehealth: Payer: Self-pay | Admitting: Internal Medicine

## 2016-12-18 MED ORDER — ISOSORBIDE MONONITRATE ER 30 MG PO TB24: 30.0000 mg | ORAL_TABLET | Freq: Every day | ORAL | 3 refills | Status: DC

## 2016-12-18 NOTE — Telephone Encounter (Signed)
S/w patient. He said his insurance company called and they won't pay for his Myoview or office visit in November with Dr End. Patient started saying how his insurance company won't cover his medications anymore at [facility] unless they are from [place]. Patient said he already ran out of his Imdur and is just going to take his other medications until the run out and then stop. He is going to stop because there are too many hoops to jump through and 3-way calls with the doctor and all and it would be easier just to stop taking his medications. Advised patient that we need to figure out a plan with his medications as it could put him at risk of stroke/heart attack, etc, if he stops his medications. Patient said its just too hard to get them from [facility] but his insurance will cover his medications from there. I let patient know all I have to do is send in a new Rx to [facility] as his insurance desires. I sent Rx for isosorbide to [facility] as Dr End prescribed. I advised patient to call Dr Jennette Kettle office who prescribed him his other medications today to refill those to [facility] as well.  We decided we would start here and he will call back if he still has a problem with getting his medications. Patient verbalized understanding.  Sent message to pre-certification/billing pool to inquire about myoview and insurance.

## 2016-12-18 NOTE — Telephone Encounter (Signed)
Charmaine M Julaine Hua, RN        We will check to see if prior Josem Kaufmann is required and if so we will obtain auth. Dr. Saunders Revel ordered for medical necessity. Not elective. Pt will need to call his insurance and state OUTPT not office as [employer] owns the equipment and it is really a satellite office when they are in the testing area. He can give them the CPT code [phone] and they can give him his benefits, responsible amount based on his individual plan. It is pt's responsibility to call insurance company

## 2016-12-18 NOTE — Telephone Encounter (Signed)
Pt calling stating he can't do the myoview on Monday Stating his insurance company won't pay for it

## 2016-12-19 NOTE — Telephone Encounter (Signed)
No answer. Left message to call back on cell phone.

## 2016-12-19 NOTE — Telephone Encounter (Signed)
Patient returned call.  Gave him the information he needed to call his insurance company. He verbalized understanding and will call. Let him know I will call him once I find out if the prior auth is complete for his myoview.

## 2016-12-20 NOTE — Telephone Encounter (Signed)
Received message from [employer] in Billing that [place] precertification with insurance was approved.  He verbalized understanding of information and instructions of myoview on 12/23/16 and to [facility]15am [facility].

## 2016-12-23 ENCOUNTER — Encounter
Admission: RE | Admit: 2016-12-23 | Discharge: 2016-12-23 | Disposition: A | Discharge status: Home / Self Care | Payer: 59 | Source: Ambulatory Visit | Attending: Internal Medicine | Admitting: Internal Medicine

## 2016-12-23 DIAGNOSIS — R079 Chest pain, unspecified: Secondary | ICD-10-CM | POA: Diagnosis not present

## 2016-12-23 LAB — NM MYOCAR MULTI W/SPECT W/WALL MOTION / EF
CHL CUP MPHR: 164 {beats}/min
CHL CUP NUCLEAR SDS: 0
CHL CUP NUCLEAR SSS: 0
CSEPHR: 50 %
CSEPPHR: 82 {beats}/min
Estimated workload: 1 METS
Exercise duration (min): 0 min
Exercise duration (sec): 0 s
LV dias vol: 106 mL (ref 62–150)
LVSYSVOL: 35 mL
Rest BP: 13067 mmHg
Rest HR: 57 {beats}/min
SRS: 0
TID: 1.1

## 2016-12-23 MED ORDER — REGADENOSON 0.4 MG/5ML IV SOLN
0.4000 mg | Freq: Once | INTRAVENOUS | Status: AC
  Administered 2016-12-23: 0.4 mg via INTRAVENOUS

## 2016-12-23 MED ORDER — TECHNETIUM TC 99M TETROFOSMIN IV KIT
29.7650 | PACK | Freq: Once | INTRAVENOUS | Status: AC | PRN
  Administered 2016-12-23: 29.765 via INTRAVENOUS

## 2016-12-23 MED ORDER — TECHNETIUM TC 99M TETROFOSMIN IV KIT
13.0000 | PACK | Freq: Once | INTRAVENOUS | Status: AC | PRN
  Administered 2016-12-23: 13.63 via INTRAVENOUS

## 2017-01-15 ENCOUNTER — Telehealth: Payer: Self-pay | Admitting: *Deleted

## 2017-01-15 DIAGNOSIS — R9389 Abnormal findings on diagnostic imaging of other specified body structures: Secondary | ICD-10-CM

## 2017-01-15 NOTE — Telephone Encounter (Signed)
Left voicemail for patient notifyng them that it is time to schedule low dose lung cancer screening follow up CT scan. Instructed patient to call back to verify information prior to the scan being scheduled.  

## 2017-01-15 NOTE — Telephone Encounter (Signed)
Notified patient that lung cancer screening low dose CT scan is due. Confirmed that patient is within the age range of 55-77, and asymptomatic, (no signs or symptoms of lung cancer). Patient denies illness that would prevent curative treatment for lung cancer if found. The patient is a current smoker, with a 57 pack year history. The shared decision making visit was done 08/06/16. Patient is agreeable for CT scan being scheduled.

## 2017-02-10 ENCOUNTER — Ambulatory Visit
Admission: RE | Admit: 2017-02-10 | Discharge: 2017-02-10 | Disposition: A | Discharge status: Home / Self Care | Payer: 59 | Source: Ambulatory Visit | Attending: Oncology | Admitting: Oncology

## 2017-02-10 DIAGNOSIS — I2510 Atherosclerotic heart disease of native coronary artery without angina pectoris: Secondary | ICD-10-CM | POA: Diagnosis not present

## 2017-02-10 DIAGNOSIS — J439 Emphysema, unspecified: Secondary | ICD-10-CM | POA: Diagnosis not present

## 2017-02-10 DIAGNOSIS — I700 Atherosclerosis of aorta: Secondary | ICD-10-CM | POA: Insufficient documentation

## 2017-02-10 DIAGNOSIS — R938 Abnormal findings on diagnostic imaging of other specified body structures: Secondary | ICD-10-CM | POA: Diagnosis present

## 2017-02-10 DIAGNOSIS — R918 Other nonspecific abnormal finding of lung field: Secondary | ICD-10-CM | POA: Diagnosis not present

## 2017-02-10 DIAGNOSIS — R9389 Abnormal findings on diagnostic imaging of other specified body structures: Secondary | ICD-10-CM

## 2017-02-12 ENCOUNTER — Ambulatory Visit: Payer: 59 | Admitting: Internal Medicine

## 2017-02-14 ENCOUNTER — Telehealth: Payer: Self-pay | Admitting: *Deleted

## 2017-02-14 NOTE — Telephone Encounter (Signed)
Notified patient of LDCT lung cancer screening results with recommendation for 12 month follow up imaging. Also notified of incidental finding noted below which patient is encouraged to discuss with PCP, who will be forwarded this note via Epic. Patient verbalizes understanding.   IMPRESSION: 1. Lung-RADS Category 2, benign appearance or behavior. Continue annual screening with low-dose chest CT without contrast in 12 months. 2. Aortic atherosclerosis (ICD10-170.0). Three-vessel coronary artery calcification. 3.  Emphysema (ICD10-J43.9).

## 2017-03-04 ENCOUNTER — Encounter: Payer: Self-pay | Admitting: Internal Medicine

## 2017-03-04 ENCOUNTER — Ambulatory Visit (INDEPENDENT_AMBULATORY_CARE_PROVIDER_SITE_OTHER): Payer: 59 | Admitting: Internal Medicine

## 2017-03-04 VITALS — BP 124/72 | HR 58 | Ht 70.0 in | Wt 241.0 lb

## 2017-03-04 DIAGNOSIS — I2584 Coronary atherosclerosis due to calcified coronary lesion: Secondary | ICD-10-CM

## 2017-03-04 DIAGNOSIS — R0789 Other chest pain: Secondary | ICD-10-CM

## 2017-03-04 DIAGNOSIS — Z720 Tobacco use: Secondary | ICD-10-CM

## 2017-03-04 DIAGNOSIS — E785 Hyperlipidemia, unspecified: Secondary | ICD-10-CM | POA: Diagnosis not present

## 2017-03-04 DIAGNOSIS — I2510 Atherosclerotic heart disease of native coronary artery without angina pectoris: Secondary | ICD-10-CM | POA: Insufficient documentation

## 2017-03-04 NOTE — Progress Notes (Signed)
Follow-up Outpatient Visit Date: 03/04/2017  Primary Care Provider: Cletis Athens, [place] [address]  Chief Complaint: Follow-up chest pain  HPI:  Cody Matthews is a 56 y.o. year-old male with history of COPD, tobacco use, and BPH, who presents for follow-up of chest pain. I first met him in 10/2016. At that time, he reported vague chest discomfort when when walking up an incline. Stress test by Dr. Lavera Guise was reportedly abnormal, though review of the records indicated that target heart rate was never achieved. We therefore agreed to perform a pharmacologic myocardial perfusion stress test, which showed no evidence of ischemia. Today, Cody Matthews reports feeling well. He notes sporadic, sharp substernal chest pain about once every 1 weeks. The pain lasts for 3-5 seconds; the patient describes it as feeling "like something needs to bust through." There are no associated symptoms. He has not had any exertional chest pain or shortness of breath. He also denies palpitations, lightheadedness, orthopnea, PND, edema, and claudication. He is trying to quit smoking but is awaiting insurance authorization for Chantix.  --------------------------------------------------------------------------------------------------  Cardiovascular History & Procedures: Cardiovascular Problems:  Stable angina with abnormal stress test  Risk Factors:  Tobacco use, male gender, and age > 19  Cath/PCI:  None  CV Surgery:  None  EP Procedures and Devices:  None  Non-Invasive Evaluation(s):  Pharmacologic myocardial perfusion stress test (12/23/16): Low risk study. There is a small in size, mild in severity, fixed defect at the apex, most likely due to apical thinning. LVEF normal.  TTE: Reportedly normal per the patient.  Stress test (10/28/16): Exercise duration of 8 minutes with peak HR 114 bpm. No significant ST segment changes (conflicting reports of chest pain during the study).:    Recent CV Pertinent Labs: Not available. Past medical and surgical history were reviewed and updated in EPIC.  Outpatient Encounter Prescriptions as of 03/04/2017  Medication Sig  . aspirin 81 MG tablet Take 81 mg by mouth daily.  Marland Kitchen atorvastatin (LIPITOR) 80 MG tablet Take by mouth daily at 6 PM.   . isosorbide mononitrate (IMDUR) 30 MG 24 hr tablet Take 1 tablet (30 mg total) by mouth daily.  . metoprolol succinate (TOPROL-XL) 100 MG 24 hr tablet Take 100 mg by mouth.   . metroNIDAZOLE (METROCREAM) 0.75 % cream Apply 1 application topically 2 (two) times daily.  . metroNIDAZOLE (METROGEL) 1 % gel APPLY TO AFFECTED AREA TWICE A DAY  . nitroGLYCERIN (NITROSTAT) 0.4 MG SL tablet Place 1 tablet (0.4 mg total) under the tongue every 5 (five) minutes as needed for chest pain.  . tamsulosin (FLOMAX) 0.4 MG CAPS capsule Take 0.4 mg by mouth daily.  . [DISCONTINUED] buPROPion (WELLBUTRIN XL) 150 MG 24 hr tablet Take 150 mg by mouth.    No facility-administered encounter medications on file as of 03/04/2017.     Allergies: Codeine and Penicillins  Social History   Social History  . Marital status: Married    Spouse name: N/A  . Number of children: N/A  . Years of education: N/A   Occupational History  . Not on file.   Social History Main Topics  . Smoking status: Current Every Day Smoker    Packs/day: 1.00    Years: 38.00    Types: Cigarettes  . Smokeless tobacco: Never Used  . Alcohol use 0.0 oz/week     Comment: Drinks 1 beer every few weeks to month  . Drug use: No  . Sexual activity: Not on file  Other Topics Concern  . Not on file   Social History Narrative  . No narrative on file    Family History  Problem Relation Age of Onset  . Breast cancer Mother   . Emphysema Father   . Heart disease Father   . Heart attack Maternal Grandfather 62  . Stroke Paternal Grandmother   . Heart disease Paternal Grandfather     Pacemaker  . Kidney cancer Neg Hx   . Kidney  disease Neg Hx   . Prostate cancer Neg Hx     Review of Systems: A 12-system review of systems was performed and was negative except as noted in the HPI.  --------------------------------------------------------------------------------------------------  Physical Exam: BP 124/72 (BP Location: Left Arm, Patient Position: Sitting, Cuff Size: Normal)   Pulse (!) 58   Ht '5\' 10"'  (1.778 m)   Wt 241 lb (109.3 kg)   BMI 34.58 kg/m   General:  Obese man, seated comfortably in the exam room. HEENT: No conjunctival pallor or scleral icterus.  Moist mucous membranes.  OP clear. Neck: Supple without lymphadenopathy, thyromegaly, JVD, or HJR. Lungs: Normal work of breathing.  Clear to auscultation bilaterally without wheezes or crackles. Heart: Regular rate and rhythm without murmurs, rubs, or gallops.  Non-displaced PMI. Abd: Bowel sounds present.  Soft, NT/ND without hepatosplenomegaly Ext: Trace pretibial edema.  Radial, PT, and DP pulses are 2+ bilaterally. Skin: warm and dry without rash  EKG:  Sinus bradycardia with 1st degree AV block. No other significant abnormalities. Heart rate has decreased slightly from prior tracing on 11/13/16. Otherwise, there has been no significant interval change (I have personally reviewed both tracings).  Outside labs (10/30/16): CBC: WBC 8.1, HGB 16.7, HCT 47.3, PLT 245  Na 140, K 4.1, Cl 98, CO2 24, BUN 11, creatinine 0.93, glucose 97, Ca 9.6  Total cholesterol 149, triglycerides 291, HDL 23, LDL 68  TSH 1.960  --------------------------------------------------------------------------------------------------  ASSESSMENT AND PLAN: Atypical chest pain with coronary artery calcification Vague exertional chest pain described by the patient at his last visit has resolved. He continues to have brief, sharp chest pain that is non-exertional and may represent a transient arrhythmia (such as a PAC or PVC) or a non-cardiac etiology. Though significant coronary  artery calcium is evident on his recent lung cancer screening chest CT (I have personally reviewed the images), his myocardial perfusion stress test is reassuring without evidence of ischemia. We will continue with medical therapy. If his pain worsens, Cody Matthews was advised to contact us so that we can proceed with cardiac catheterization.  Dyslipidemia LDL at target, though triglycerides and HDL are still abnormal. We will continue with atorvastatin. I have also recommended lifestyle modification, including exercise.  Tobacco abuse Smoking cessation counseling provided. The patient is eager to try Chantix, as he has had some success with this in the past. I advised him to follow-up with Dr. Jennette Kettle office regarding insurance authorization for this medication.  Follow-up: Return to clinic in 6 months.  Nelva Bush, MD 03/04/2017 9:52 PM

## 2017-03-04 NOTE — Patient Instructions (Signed)

## 2017-05-28 ENCOUNTER — Ambulatory Visit
Admission: RE | Admit: 2017-05-28 | Discharge: 2017-05-28 | Disposition: A | Discharge status: Home / Self Care | Payer: 59 | Source: Ambulatory Visit | Attending: Internal Medicine | Admitting: Internal Medicine

## 2017-05-28 ENCOUNTER — Other Ambulatory Visit: Payer: Self-pay | Admitting: Internal Medicine

## 2017-05-28 DIAGNOSIS — M25512 Pain in left shoulder: Secondary | ICD-10-CM | POA: Insufficient documentation

## 2017-05-28 DIAGNOSIS — M25511 Pain in right shoulder: Secondary | ICD-10-CM | POA: Diagnosis present

## 2017-05-28 DIAGNOSIS — M19011 Primary osteoarthritis, right shoulder: Secondary | ICD-10-CM | POA: Diagnosis not present

## 2017-05-28 DIAGNOSIS — M546 Pain in thoracic spine: Secondary | ICD-10-CM | POA: Diagnosis present

## 2017-05-28 DIAGNOSIS — R52 Pain, unspecified: Secondary | ICD-10-CM

## 2017-05-28 DIAGNOSIS — M5134 Other intervertebral disc degeneration, thoracic region: Secondary | ICD-10-CM | POA: Diagnosis not present

## 2017-07-03 ENCOUNTER — Other Ambulatory Visit: Payer: Self-pay | Admitting: Internal Medicine

## 2017-07-03 DIAGNOSIS — M4802 Spinal stenosis, cervical region: Secondary | ICD-10-CM

## 2017-07-03 DIAGNOSIS — M47812 Spondylosis without myelopathy or radiculopathy, cervical region: Secondary | ICD-10-CM

## 2017-07-10 ENCOUNTER — Ambulatory Visit: Payer: 59

## 2017-07-18 ENCOUNTER — Ambulatory Visit
Admission: RE | Admit: 2017-07-18 | Discharge: 2017-07-18 | Disposition: A | Discharge status: Home / Self Care | Payer: 59 | Source: Ambulatory Visit | Attending: Internal Medicine | Admitting: Internal Medicine

## 2017-07-18 DIAGNOSIS — M4692 Unspecified inflammatory spondylopathy, cervical region: Secondary | ICD-10-CM | POA: Insufficient documentation

## 2017-07-18 DIAGNOSIS — M4802 Spinal stenosis, cervical region: Secondary | ICD-10-CM | POA: Diagnosis not present

## 2017-07-18 DIAGNOSIS — M47892 Other spondylosis, cervical region: Secondary | ICD-10-CM | POA: Insufficient documentation

## 2017-07-18 DIAGNOSIS — M47812 Spondylosis without myelopathy or radiculopathy, cervical region: Secondary | ICD-10-CM

## 2017-08-27 DIAGNOSIS — M542 Cervicalgia: Secondary | ICD-10-CM | POA: Insufficient documentation

## 2017-09-01 ENCOUNTER — Ambulatory Visit: Payer: 59 | Admitting: Physical Therapy

## 2017-09-03 ENCOUNTER — Ambulatory Visit: Payer: 59 | Attending: Neurology | Admitting: Physical Therapy

## 2017-09-03 DIAGNOSIS — M542 Cervicalgia: Secondary | ICD-10-CM

## 2017-09-03 DIAGNOSIS — M6281 Muscle weakness (generalized): Secondary | ICD-10-CM

## 2017-09-03 DIAGNOSIS — M2560 Stiffness of unspecified joint, not elsewhere classified: Secondary | ICD-10-CM

## 2017-09-03 DIAGNOSIS — M5412 Radiculopathy, cervical region: Secondary | ICD-10-CM

## 2017-09-07 ENCOUNTER — Encounter: Payer: Self-pay | Admitting: Physical Therapy

## 2017-09-07 NOTE — Therapy (Addendum)
[facility] [address] [place], [place], [address] Phone: [phone]   Fax:  [phone]  Physical Therapy Evaluation  Patient Details  Name: Cody Matthews MRN: [id number] Date of Birth: Feb 13, 1961 Referring Provider: Dr. Melrose Nakayama  Encounter Date: 09/03/2017      PT End of Session - 09/07/17 2042    Visit Number 1   Number of Visits 8   Date for PT Re-Evaluation 10/01/17   PT Start Time 6237   PT Stop Time 1623   PT Time Calculation (min) 53 min   Activity Tolerance Patient tolerated treatment well;Patient limited by pain       Past Medical History:  Diagnosis Date  . Appendicitis   . BPH (benign prostatic hyperplasia)   . Emphysema lung ([place])   . Hernia, incisional   . Hyperlipidemia   . Lung nodules   . Rosacea   . Sprain    rotator cuff  . Wears dentures    full upper and lower    Past Surgical History:  Procedure Laterality Date  . ABDOMINAL HERNIA REPAIR  2009  . APPENDECTOMY    . CARDIAC CATHETERIZATION  approx 2000   no issues  . COLONOSCOPY WITH PROPOFOL N/A 10/19/2015   Procedure: COLONOSCOPY WITH PROPOFOL;  Surgeon: Lucilla Lame, MD;  Location: [place];  Service: Endoscopy;  Laterality: N/A;  . POLYPECTOMY  10/19/2015   Procedure: POLYPECTOMY;  Surgeon: Lucilla Lame, MD;  Location: [place];  Service: Endoscopy;;  . SHOULDER SURGERY Left 2014, 2015    There were no vitals filed for this visit.    Pt. 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  Lifts between 2 to 50# on a regular basis.      Manual tx:  Supine cervical traction 3x with static holds.   L/R UT and levator stretches 2x each.  Cervical AA/PROM all planes as tolerated in supine/ seated position.  See HEP.          Pt. is a pleasant 56 y/o with > 3 months neck/ mid-scapular pain with work-related tasks.  Pt. reports 3/10 neck pain  at rest and worsening symptoms for 1-2 days after completing lifting/ yardwork.  Pt. has increase neck pain in supine/ prone positioning.  Moderate generalized cervical hypomobility with no radicular symptoms noted.  Cervical AROM:  flexion 40 deg./ extension 38 deg./ R rotn. 56 deg./ L rotn. 50 deg./ R lat. flexion 29 deg./ L lat. flexion 35 deg.  No change in symptoms with cervical traction/ compression. B shoulder AROM WFL.  B UE muscle strength grossly 5/5 MMT.  L sh. discomfort with overhead reaching/ lifting tasks.  NDI: 48%.  Pt. will benefit from skilled PT services to increase cervical AROM/ pain-free mobility to promote return to work.            PT Long Term Goals - 09/14/17 2134      PT LONG TERM GOAL #1   Title Pt. I with HEP to increase cervical AROM to [facility] to improve pain-free mobility/ promote return to work.     Baseline Moderate generalized cervical hypomobility with no radicular symptoms noted.  Cervical AROM:  flexion 40 deg./ extension 38 deg./ R rotn. 56 deg./ L rotn. 50 deg./ R lat. flexion 29 deg./ L lat. flexion 35 deg.    Time 4   Period Weeks   Status New  Target Date 10/01/17     PT LONG TERM GOAL #2   Title Pt. will demonstrate proper lifting/ B carrying techniques (up to 50#) to promote return to work.     Baseline OOW for 3 months secondary to neck pain.     Time 4   Period Weeks   Status New   Target Date 10/01/17     PT LONG TERM GOAL #3   Title Pt. will decrease NDI to <30% to improve neck pain with daily tasks.     Baseline NDI: 48% on 9/19   Time 4   Period Weeks   Status New   Target Date 10/01/17              Patient will benefit from skilled therapeutic intervention in order to improve the following deficits and impairments:  Improper body mechanics, Pain, Postural dysfunction, Hypomobility, Decreased strength, Decreased range of motion, Decreased endurance, Decreased activity tolerance, Decreased mobility, Impaired  flexibility  Visit Diagnosis: Neck pain  Radiculopathy, cervical region  Joint stiffness  Muscle weakness (generalized)     Problem List Patient Active Problem List   Diagnosis Date Noted  . Atypical chest pain 03/04/2017  . Coronary artery calcification 03/04/2017  . Dyslipidemia 03/04/2017  . Tobacco abuse 03/04/2017  . Personal history of tobacco use, presenting hazards to health 08/12/2016  . Special screening for malignant neoplasms, colon   . Benign neoplasm of descending colon   . Benign neoplasm of sigmoid colon    Pura Spice, PT, DPT # [id number] 09/14/2017, 9:39 PM  [facility] [address] [place], [place], [address] Phone: [phone]   Fax:  [phone]  Name: Cody Matthews MRN: [id number] Date of Birth: 06-Nov-1961

## 2017-09-10 ENCOUNTER — Ambulatory Visit: Payer: 59 | Admitting: Physical Therapy

## 2017-09-10 DIAGNOSIS — M542 Cervicalgia: Secondary | ICD-10-CM | POA: Diagnosis not present

## 2017-09-10 DIAGNOSIS — M2560 Stiffness of unspecified joint, not elsewhere classified: Secondary | ICD-10-CM

## 2017-09-10 DIAGNOSIS — M5412 Radiculopathy, cervical region: Secondary | ICD-10-CM

## 2017-09-10 DIAGNOSIS — M6281 Muscle weakness (generalized): Secondary | ICD-10-CM

## 2017-09-12 NOTE — Therapy (Addendum)
[facility] [address] [place], [place], [address] Phone: [phone]   Fax:  [phone]  Physical Therapy Treatment  Patient Details  Name: Cody Matthews MRN: [id number] Date of Birth: 1961-02-13 Referring Provider: Dr. Melrose Nakayama  Encounter Date: 09/10/2017      PT End of Session - 09/11/17 1349    Visit Number 2   Number of Visits 8   Date for PT Re-Evaluation 10/01/17   PT Start Time 4332   PT Stop Time 1501   PT Time Calculation (min) 48 min   Activity Tolerance Patient tolerated treatment well;Patient limited by pain   Behavior During Therapy [facility] for tasks assessed/performed      Past Medical History:  Diagnosis Date  . Appendicitis   . BPH (benign prostatic hyperplasia)   . Emphysema lung ([place])   . Hernia, incisional   . Hyperlipidemia   . Lung nodules   . Rosacea   . Sprain    rotator cuff  . Wears dentures    full upper and lower    Past Surgical History:  Procedure Laterality Date  . ABDOMINAL HERNIA REPAIR  2009  . APPENDECTOMY    . CARDIAC CATHETERIZATION  approx 2000   no issues  . COLONOSCOPY WITH PROPOFOL N/A 10/19/2015   Procedure: COLONOSCOPY WITH PROPOFOL;  Surgeon: Lucilla Lame, MD;  Location: [place];  Service: Endoscopy;  Laterality: N/A;  . POLYPECTOMY  10/19/2015   Procedure: POLYPECTOMY;  Surgeon: Lucilla Lame, MD;  Location: [place];  Service: Endoscopy;;  . SHOULDER SURGERY Left 2014, 2015    There were no vitals filed for this visit.      Subjective Assessment - 09/11/17 1348    Limitations House hold activities;Lifting   Patient Stated Goals Decrease neck pain   Currently in Pain? Yes      Pt. reports no new issues.  Neck pain remains 2-3/10 (low/mid region) currently at rest.  Pt. states he has difficulty with standing pec. stretch secondary to sh. pain.           There.ex.:  Reviewed HEP.  Supine B shoulder flexion/ horizontal abd. (pec stretches)  as tolerated with static holds.  Cervical isometrics 5x with manual feedback (rotn./ chin tucks)- pain tolerable.  Standing scap. Retraction with feedback.    Manual tx:  Supine cervical traction 3x with holds (as tolerated).  STM to cervical paraspinals/ UT region/ posterior deltoid in supine position.  L/R UT and levator stretches 4x each.  Cervical rotn. L/R 5x each (as tolerated).  Prone PA grade II-III Mobs. To cervical/ mid-thoracic region (central/ unilateral).  STM to mid-scapular region with MH.  Discuss ice vs. Heat.         No increase cervical pain with supine c-spine isometrics (rotn./ extension) 5x each.  Moderate cervical/thoracic spinal mobility in prone position with no radicular symptoms.  Progress HEP to more postural/ strengthening ex. to promote pt. back to work.           Plan - 09/11/17 1350    Clinical Presentation Stable   Clinical Decision Making Moderate   Rehab Potential Good   PT Frequency 2x / week   PT Duration 4 weeks   PT Treatment/Interventions ADLs/Self Care Home Management;Cryotherapy;Moist Heat;Therapeutic exercise;Therapeutic activities;Functional mobility training;Neuromuscular re-education;Patient/family education;Passive range of motion;Manual techniques;Dry needling   PT Next Visit Plan Progress HEP/ strengthening ex.    PT Home Exercise Plan see handouts   Consulted and Agree with Plan  of Care Patient      Patient will benefit from skilled therapeutic intervention in order to improve the following deficits and impairments:  Improper body mechanics, Pain, Postural dysfunction, Hypomobility, Decreased strength, Decreased range of motion, Decreased endurance, Decreased activity tolerance, Decreased mobility, Impaired flexibility  Visit Diagnosis: Neck pain  Radiculopathy, cervical region  Muscle weakness (generalized)  Joint stiffness     Problem List Patient Active Problem List   Diagnosis Date Noted  . Atypical chest pain  03/04/2017  . Coronary artery calcification 03/04/2017  . Dyslipidemia 03/04/2017  . Tobacco abuse 03/04/2017  . Personal history of tobacco use, presenting hazards to health 08/12/2016  . Special screening for malignant neoplasms, colon   . Benign neoplasm of descending colon   . Benign neoplasm of sigmoid colon    Pura Spice, PT, DPT # [id number] 09/12/2017, 1:53 PM  [place] [facility] [address] [place], [place], [address] Phone: [phone]   Fax:  [phone]  Name: Cody Matthews MRN: [id number] Date of Birth: 10/03/1961

## 2017-09-14 NOTE — Addendum Note (Signed)
Addended by: Pura Spice on: 09/14/2017 09:45 PM   Modules accepted: Orders

## 2017-09-15 ENCOUNTER — Ambulatory Visit: Payer: 59 | Attending: Neurology

## 2017-09-15 ENCOUNTER — Other Ambulatory Visit: Payer: Self-pay | Admitting: Internal Medicine

## 2017-09-15 DIAGNOSIS — M542 Cervicalgia: Secondary | ICD-10-CM | POA: Diagnosis present

## 2017-09-15 DIAGNOSIS — M6281 Muscle weakness (generalized): Secondary | ICD-10-CM | POA: Diagnosis present

## 2017-09-15 DIAGNOSIS — M2560 Stiffness of unspecified joint, not elsewhere classified: Secondary | ICD-10-CM | POA: Insufficient documentation

## 2017-09-15 DIAGNOSIS — M5412 Radiculopathy, cervical region: Secondary | ICD-10-CM | POA: Diagnosis present

## 2017-09-15 NOTE — Therapy (Signed)
[facility] [facility] [address] [place], [place], [address] Phone: [phone]   Fax:  [phone]  Physical Therapy Treatment  Patient Details  Name: Cody Matthews MRN: [id number] Date of Birth: 10-Sep-1961 Referring Provider: Dr. Melrose Nakayama  Encounter Date: 09/15/2017      PT End of Session - 09/15/17 1026    Visit Number 3   Number of Visits 8   Date for PT Re-Evaluation 10/01/17   PT Start Time 1030   PT Stop Time 1115   PT Time Calculation (min) 45 min   Activity Tolerance Patient tolerated treatment well   Behavior During Therapy [facility] for tasks assessed/performed      Past Medical History:  Diagnosis Date  . Appendicitis   . BPH (benign prostatic hyperplasia)   . Emphysema lung ([place])   . Hernia, incisional   . Hyperlipidemia   . Lung nodules   . Rosacea   . Sprain    rotator cuff  . Wears dentures    full upper and lower    Past Surgical History:  Procedure Laterality Date  . ABDOMINAL HERNIA REPAIR  2009  . APPENDECTOMY    . CARDIAC CATHETERIZATION  approx 2000   no issues  . COLONOSCOPY WITH PROPOFOL N/A 10/19/2015   Procedure: COLONOSCOPY WITH PROPOFOL;  Surgeon: Lucilla Lame, MD;  Location: [place];  Service: Endoscopy;  Laterality: N/A;  . POLYPECTOMY  10/19/2015   Procedure: POLYPECTOMY;  Surgeon: Lucilla Lame, MD;  Location: [place];  Service: Endoscopy;;  . SHOULDER SURGERY Left 2014, 2015    There were no vitals filed for this visit.      Subjective Assessment - 09/15/17 1026    Subjective Pt reports approximately 2/10 central ache/sharp pain in his neck. Believes that he is doing slighlty better today compared to prior session. HEP is going well. No specific questions or concerns on this date. He returns to see his MD 09/24/17.   Pertinent History Pt. is currently scheduled to be out of work until MD f/u on 09/24/17.   Pt. is L handed.  See MRI report.  Pt. has previous L shoulder  surgeries (2x).   Pt. states neck pain started around 04/2017.  Pt. will take 2 days to recover after completing yard work.  Tylenol does not help.  Pt. lives alone.  Cramping in B lower legs at night.     Limitations House hold activities;Lifting   Diagnostic tests See MRI report.     Patient Stated Goals Decrease neck pain   Currently in Pain? Yes   Pain Score 2    Pain Location Neck   Pain Orientation Mid;Lower   Pain Descriptors / Indicators Aching;Sharp   Pain Onset More than a month ago   Pain Frequency Constant         ??    TREATMENT  Ther-ex Reviewed HEP. ? Supine B shoulder horizontal abd. (pec stretches) as tolerated with static holds while laying on foam roller; Seated cervical retractions with scap retractions 5s hold 2 x 10;? Seated scap. retraction with feedback 2 x 10;  Manual Supine cervical traction 10-20s holds x 5 (as tolerated); L/R UT and levator stretches 4x each bilateral; ? Cervical rotn and lateral flexion AAROM with end range stretch L/R x 5 each as tolerated. ? C2 rotational mobilization grade II-III 30s/bout x 3 bouts each bilateral; Seated C2 rotational mobilization with movement x 5 in each direction; STM to cervical paraspinals/ UT region/  posterior deltoid in supine position. ? Prone CPA grade II-III mobs C2-T4 20s/bout x 1 bouts at each level, increased tenderness over upper thoracic spine;? Supine self thoracic mobilizations with intermittent cavitation on foam roller, scapular protraction with thoracic flexion/extension at multiple levels on foam roller;   Pt with increase tenderness to mobilizations along upper thoracic spine. Multiple cavitations with self mobilizations over foam roller but no relief of neck pain with this. Improved cervical rotation following manual techniques. Continue HEP and follow-up as scheduled. Pt will continue to benefit from skilled PT services to address deficits in neck pain and return to full function.                           PT Education - 09/15/17 1026    Education provided Yes   Education Details Exercise form/technique   Person(s) Educated Patient   Methods Explanation   Comprehension Verbalized understanding             PT Long Term Goals - 09/14/17 2134      PT LONG TERM GOAL #1   Title Pt. I with HEP to increase cervical AROM to [facility] to improve pain-free mobility/ promote return to work.     Baseline Moderate generalized cervical hypomobility with no radicular symptoms noted.  Cervical AROM:  flexion 40 deg./ extension 38 deg./ R rotn. 56 deg./ L rotn. 50 deg./ R lat. flexion 29 deg./ L lat. flexion 35 deg.    Time 4   Period Weeks   Status New   Target Date 10/01/17     PT LONG TERM GOAL #2   Title Pt. will demonstrate proper lifting/ B carrying techniques (up to 50#) to promote return to work.     Baseline OOW for 3 months secondary to neck pain.     Time 4   Period Weeks   Status New   Target Date 10/01/17     PT LONG TERM GOAL #3   Title Pt. will decrease NDI to <30% to improve neck pain with daily tasks.     Baseline NDI: 48% on 9/19   Time 4   Period Weeks   Status New   Target Date 10/01/17               Plan - 09/15/17 1026    Clinical Impression Statement Pt with increase tenderness to mobilizations along upper thoracic spine. Multiple cavitations with self mobilizations over foam roller but no relief of neck pain with this. Improved cervical rotation following manual techniques. Continue HEP and follow-up as scheduled. Pt will continue to benefit from skilled PT services to address deficits in neck pain and return to full function.    Clinical Presentation Stable   Rehab Potential Good   PT Frequency 2x / week   PT Duration 4 weeks   PT Treatment/Interventions ADLs/Self Care Home Management;Cryotherapy;Moist Heat;Therapeutic exercise;Therapeutic activities;Functional mobility training;Neuromuscular  re-education;Patient/family education;Passive range of motion;Manual techniques;Dry needling   PT Next Visit Plan Progress HEP/ issue UE strengthening ex.  Simulate work-related tasks.     PT Home Exercise Plan see handouts   Consulted and Agree with Plan of Care Patient      Patient will benefit from skilled therapeutic intervention in order to improve the following deficits and impairments:  Improper body mechanics, Pain, Postural dysfunction, Hypomobility, Decreased strength, Decreased range of motion, Decreased endurance, Decreased activity tolerance, Decreased mobility, Impaired flexibility  Visit Diagnosis: Neck pain  Radiculopathy, cervical region  Problem List Patient Active Problem List   Diagnosis Date Noted  . Atypical chest pain 03/04/2017  . Coronary artery calcification 03/04/2017  . Dyslipidemia 03/04/2017  . Tobacco abuse 03/04/2017  . Personal history of tobacco use, presenting hazards to health 08/12/2016  . Special screening for malignant neoplasms, colon   . Benign neoplasm of descending colon   . Benign neoplasm of sigmoid colon    Phillips Grout PT, DPT   Huprich,Jason 09/15/2017, 12:30 PM  [facility] [address] [place], [place], [address] Phone: [phone]   Fax:  [phone]  Name: Cody Matthews MRN: [id number] Date of Birth: 09/03/1961

## 2017-09-23 ENCOUNTER — Ambulatory Visit: Payer: 59 | Admitting: Physical Therapy

## 2017-09-23 ENCOUNTER — Encounter: Payer: Self-pay | Admitting: Physical Therapy

## 2017-09-23 DIAGNOSIS — M542 Cervicalgia: Secondary | ICD-10-CM

## 2017-09-23 DIAGNOSIS — M5412 Radiculopathy, cervical region: Secondary | ICD-10-CM

## 2017-09-23 DIAGNOSIS — M2560 Stiffness of unspecified joint, not elsewhere classified: Secondary | ICD-10-CM

## 2017-09-23 DIAGNOSIS — M6281 Muscle weakness (generalized): Secondary | ICD-10-CM

## 2017-09-23 NOTE — Therapy (Signed)
[facility] [address] [place], [place], [address] Phone: [phone]   Fax:  [phone]  Physical Therapy Treatment  Patient Details  Name: Cody Matthews MRN: [id number] Date of Birth: 07/04/1961 Referring Provider: Dr. Melrose Nakayama  Encounter Date: 09/23/2017      PT End of Session - 09/23/17 1124    Visit Number 4   Number of Visits 8   Date for PT Re-Evaluation 10/01/17   PT Start Time 1008   PT Stop Time 1103   PT Time Calculation (min) 55 min   Activity Tolerance Patient tolerated treatment well   Behavior During Therapy [facility] for tasks assessed/performed      Past Medical History:  Diagnosis Date  . Appendicitis   . BPH (benign prostatic hyperplasia)   . Emphysema lung ([place])   . Hernia, incisional   . Hyperlipidemia   . Lung nodules   . Rosacea   . Sprain    rotator cuff  . Wears dentures    full upper and lower    Past Surgical History:  Procedure Laterality Date  . ABDOMINAL HERNIA REPAIR  2009  . APPENDECTOMY    . CARDIAC CATHETERIZATION  approx 2000   no issues  . COLONOSCOPY WITH PROPOFOL N/A 10/19/2015   Procedure: COLONOSCOPY WITH PROPOFOL;  Surgeon: Lucilla Lame, MD;  Location: [place];  Service: Endoscopy;  Laterality: N/A;  . POLYPECTOMY  10/19/2015   Procedure: POLYPECTOMY;  Surgeon: Lucilla Lame, MD;  Location: [place];  Service: Endoscopy;;  . SHOULDER SURGERY Left 2014, 2015    There were no vitals filed for this visit.      Subjective Assessment - 09/23/17 1120    Subjective Pt. reports increase mid-back discomfort after last tx. due to bolster ex.  Pt. stated neck is doing okay but L shoulder sore/discomfort today.  Pt. returns to MD tomorrow to discuss RTW/ neck discomfort.     Pertinent History Pt. is currently scheduled to be out of work until MD f/u on 09/24/17.   Pt. is L handed.  See MRI report.  Pt. has previous L shoulder surgeries (2x).   Pt. states neck pain  started around 04/2017.  Pt. will take 2 days to recover after completing yard work.  Tylenol does not help.  Pt. lives alone.  Cramping in B lower legs at night.     Limitations House hold activities;Lifting   Diagnostic tests See MRI report.     Patient Stated Goals Decrease neck pain   Currently in Pain? Yes   Pain Score 4    Pain Location Neck   Pain Orientation Mid;Lower   Pain Descriptors / Indicators Aching;Sharp   Pain Type Chronic pain        TREATMENT  Ther-ex Supine B shoulder flexion/ press-ups AAROM with wand (cuing to relax cervical musculature).   Nautilus: 50# lat. Pull downs 10x (sh. Discomfort)/ 30# tricep ext. 30x (good technique). 50# floor to waist box lifting/ carrying.  Sled push/pull 2x in hallway (discomfort with pulling vs. Pushing).   Seated cervical retractions with scap retractions 5s hold 2 x 10;? Seated scap. retraction with feedback 2 x 10;  Manual Supine cervical traction 10-20s holds x 5 (as tolerated); L/R UT and levator stretches 4x each bilateral; ? Cervical rotn and lateral flexion AAROM with end range stretch L/R x 5 each as tolerated. ? STM to B UT/posterior deltoid region in supine and seated position.  Reviewed HEP.  Pt.  Instructed to contact PT after MD f/u tomorrow.         PT Long Term Goals - 09/23/17 1033      PT LONG TERM GOAL #1   Title Pt. I with HEP to increase cervical AROM to [facility] to improve pain-free mobility/ promote return to work.     Baseline Cervical AROM:  flexion 44 deg./ extension 40 deg./ R rotn. 56 deg./ L rotn. 52 deg./ R lat. flexion 36 deg./ L lat. flexion 33 deg. (less pain than during initial evaluation).   L shoulder issues remain (weakness).     Time 4   Period Weeks   Status Partially Met     PT LONG TERM GOAL #2   Title Pt. will demonstrate proper lifting/ B carrying techniques (up to 50#) to promote return to work.     Baseline 50# floor to waist lifting and B carrying with proper technique (sh.  discomfort but no increase c/o neck pain).  Sled push/pull 50# with good technique (discomfort with pulling).    Time 4   Period Weeks   Status Partially Met     PT LONG TERM GOAL #3   Title Pt. will decrease NDI to <30% to improve neck pain with daily tasks.     Baseline NDI: 48% on 9/19.   NDI on 09/23/17: 16% (marked improvement since initial evaluation).     Time 4   Period Weeks   Status Achieved            Plan - 09/23/17 1125    Clinical Impression Statement Pt. has shown slow but consistent progress with increase cervical AROM (rotn/ lateral flexion).  Pt. remains limited but chronic L shoulder discomfort/ muscle weakness as compared to R UE.  L mid-cervical muscle tenderness with palpation.  B UE muscle tightness with minimal trigger points noted.  Pt able to safely lift/ carry 50# box with slight increase c/o L shoulder/neck discomfort in PT clinic.  Pt. returns to MD for f/u appt. tomorrow to discuss neck discomfort/ RTW.     Clinical Presentation Stable   Clinical Decision Making Moderate   Rehab Potential Good   PT Frequency 2x / week   PT Duration 4 weeks   PT Treatment/Interventions ADLs/Self Care Home Management;Cryotherapy;Moist Heat;Therapeutic exercise;Therapeutic activities;Functional mobility training;Neuromuscular re-education;Patient/family education;Passive range of motion;Manual techniques;Dry needling   PT Next Visit Plan Pt. will call after MD f/u visit.     PT Home Exercise Plan see handouts   Consulted and Agree with Plan of Care Patient      Patient will benefit from skilled therapeutic intervention in order to improve the following deficits and impairments:  Improper body mechanics, Pain, Postural dysfunction, Hypomobility, Decreased strength, Decreased range of motion, Decreased endurance, Decreased activity tolerance, Decreased mobility, Impaired flexibility  Visit Diagnosis: Neck pain  Radiculopathy, cervical region  Muscle weakness  (generalized)  Joint stiffness     Problem List Patient Active Problem List   Diagnosis Date Noted  . Atypical chest pain 03/04/2017  . Coronary artery calcification 03/04/2017  . Dyslipidemia 03/04/2017  . Tobacco abuse 03/04/2017  . Personal history of tobacco use, presenting hazards to health 08/12/2016  . Special screening for malignant neoplasms, colon   . Benign neoplasm of descending colon   . Benign neoplasm of sigmoid colon    Pura Spice, PT, DPT # [phone] 09/23/2017, 11:45 AM   [facility] [address] [place], [place], [address] Phone: [phone]  Fax:  [phone]  Name: FONG MCCARRY MRN: [id number] Date of Birth: 03-Jan-1961

## 2017-09-24 ENCOUNTER — Ambulatory Visit (INDEPENDENT_AMBULATORY_CARE_PROVIDER_SITE_OTHER): Payer: 59 | Admitting: Internal Medicine

## 2017-09-24 ENCOUNTER — Encounter: Payer: Self-pay | Admitting: Internal Medicine

## 2017-09-24 ENCOUNTER — Telehealth: Payer: Self-pay | Admitting: Internal Medicine

## 2017-09-24 VITALS — BP 158/68 | HR 59 | Ht 70.5 in | Wt 238.2 lb

## 2017-09-24 DIAGNOSIS — R252 Cramp and spasm: Secondary | ICD-10-CM

## 2017-09-24 DIAGNOSIS — I10 Essential (primary) hypertension: Secondary | ICD-10-CM | POA: Diagnosis not present

## 2017-09-24 DIAGNOSIS — R0789 Other chest pain: Secondary | ICD-10-CM

## 2017-09-24 DIAGNOSIS — R5383 Other fatigue: Secondary | ICD-10-CM | POA: Diagnosis not present

## 2017-09-24 DIAGNOSIS — E785 Hyperlipidemia, unspecified: Secondary | ICD-10-CM

## 2017-09-24 MED ORDER — AMLODIPINE BESYLATE 5 MG PO TABS: 5.0000 mg | ORAL_TABLET | Freq: Every day | ORAL | 3 refills | Status: DC

## 2017-09-24 NOTE — Telephone Encounter (Signed)
After patient left from office visit, Dr. Saunders Revel asked to contact patient to have him get BMP, Mg at his earliest convenience due to him having leg cramping.  Patient verbalized understanding and asked to have lab slips so he can go to [facility] for the lab work because it is free for him there. Orders and lab slips printed and left at front desk for pick up. Patient will come by tomorrow to get them.

## 2017-09-24 NOTE — Progress Notes (Signed)
Follow-up Outpatient Visit Date: 09/24/2017  Primary Care Provider: Cletis Athens, [place] [address]  Chief Complaint: Fatigue  HPI:  Mr. Tiso is a 56 y.o. year-old male with history of COPD, tobacco use, and BPH, who presents for follow-up of chest pain and fatigue. I last saw him in March, which time he was feeling well. Today, he notes some "ups and downs." He frequently feels sleepy and somewhat sluggish. He does not sleep well at night but intermittent during the day. He thinks he may have some orthopnea or sleep apnea, as he has been told that his breathing is shallow when he is lying flat. He has not had any further chest pain. He notes mild shortness of breath with activity. He also has real palpitations without lightheadedness. No edema.  Mr. Hevia notes occasional leg cramps, especially at night. He uses over-the-counter potassium supplements for this. He also notes that his weight has been running high. Mr. Dini is concerned about erectile dysfunction associated with isosorbide mononitrate.  --------------------------------------------------------------------------------------------------  Cardiovascular History & Procedures: Cardiovascular Problems:  Stable angina with abnormal stress test  Risk Factors:  Tobacco use, male gender, and age > 57  Cath/PCI:  None  CV Surgery:  None  EP Procedures and Devices:  None  Non-Invasive Evaluation(s):  Pharmacologic myocardial perfusion stress test (12/23/16): Low risk study. There is a small in size, mild in severity, fixed defect at the apex, most likely due to apical thinning. LVEF normal.  TTE: Reportedly normal per the patient.  Stress test (10/28/16): Exercise duration of 8 minutes with peak HR 114 bpm. No significant ST segment changes (conflicting reports of chest pain during the study).:   Recent CV Pertinent Labs: No results found for: CHOL, HDL, LDLCALC, LDLDIRECT, TRIG, CHOLHDL,  INR, BNP, K, MG, BUN, CREATININE  Past medical and surgical history were reviewed and updated in EPIC.  Current Meds  Medication Sig  . aspirin 81 MG tablet Take 81 mg by mouth daily.  Marland Kitchen atorvastatin (LIPITOR) 80 MG tablet Take by mouth daily at 6 PM.   . gabapentin (NEURONTIN) 300 MG capsule Take 300 mg by mouth 3 (three) times daily.  . metoprolol succinate (TOPROL-XL) 100 MG 24 hr tablet Take 100 mg by mouth.   . metroNIDAZOLE (METROCREAM) 0.75 % cream Apply 1 application topically 2 (two) times daily.  . metroNIDAZOLE (METROGEL) 1 % gel APPLY TO AFFECTED AREA TWICE A DAY  . tamsulosin (FLOMAX) 0.4 MG CAPS capsule Take 0.4 mg by mouth daily.  . [DISCONTINUED] isosorbide mononitrate (IMDUR) 30 MG 24 hr tablet Take 30 mg by mouth daily.    Allergies: Codeine and Penicillins  Social History   Social History  . Marital status: Single    Spouse name: N/A  . Number of children: N/A  . Years of education: N/A   Occupational History  . Not on file.   Social History Main Topics  . Smoking status: Current Every Day Smoker    Packs/day: 1.00    Years: 38.00    Types: Cigarettes  . Smokeless tobacco: Never Used  . Alcohol use 0.0 oz/week     Comment: Drinks 1 beer every few weeks to month  . Drug use: No  . Sexual activity: Not on file   Other Topics Concern  . Not on file   Social History Narrative  . No narrative on file    Family History  Problem Relation Age of Onset  . Breast cancer Mother   .  Emphysema Father   . Heart disease Father   . Heart attack Maternal Grandfather 62  . Stroke Paternal Grandmother   . Heart disease Paternal Grandfather        Pacemaker  . Kidney cancer Neg Hx   . Kidney disease Neg Hx   . Prostate cancer Neg Hx     Review of Systems: A 12-system review of systems was performed and was negative except as noted in the HPI.  --------------------------------------------------------------------------------------------------  Physical  Exam: BP (!) 158/68 (BP Location: Left Arm, Patient Position: Sitting, Cuff Size: Normal)   Pulse (!) 59   Ht 5' 10.5" (1.791 m)   Wt 238 lb 4 oz (108.1 kg)   BMI 33.70 kg/m   General:  Obese man, seated comfortably in the exam room. HEENT: No conjunctival pallor or scleral icterus. Moist mucous membranes.  OP clear. Neck: Supple without lymphadenopathy, thyromegaly, JVD, or HJR.  Lungs: Normal work of breathing. Clear to auscultation bilaterally without wheezes or crackles. Heart: Regular rate and rhythm without murmurs, rubs, or gallops. Non-displaced PMI. Abd: Bowel sounds present. Soft, NT/ND without hepatosplenomegaly Ext: No lower extremity edema. Radial, PT, and DP pulses are 2+ bilaterally. Skin: Warm and dry without rash.  EKG:  Sinus bradycardia (heart rate 59 minute) with PACs. Otherwise, no significant abnormalities.  No results found for: WBC, HGB, HCT, MCV, PLT  No results found for: NA, K, CL, CO2, BUN, CREATININE, GLUCOSE, ALT  No results found for: CHOL, HDL, LDLCALC, LDLDIRECT, TRIG, CHOLHDL  --------------------------------------------------------------------------------------------------  ASSESSMENT AND PLAN: Fatigue Is likely multifactorial. Mr. Caul could have some degree of obstructive sleep apnea, given that he reportedly snores and has shallow breathing at night and also naps during the day. We will readdress sleep study when he returns for follow-up. Alternatively, this could be arranged by his PCP.  Atypical chest pain No recurrence since her last visit. Myocardial perfusion stress test was low risk. Given concern for erectile dysfunction and inability to use a PDE 5 inhibitor with isosorbide mononitrate, we will discontinue isosorbide mononitrate and start amlodipine 5 mg daily.  Hypertension Blood pressure subtotally controlled today. As above, we will stop isosorbide mononitrate and initiate amlodipine 5 mg daily. Mr. Ligman is to continue his current  dose of metoprolol.  Hyperlipidemia Continue atorvastatin.  Leg cramps We will check a potassium and magnesium level at the patient's convenience.  Follow-up: Return to clinic in 3 months.  Nelva Bush, MD 09/25/2017 8:46 PM

## 2017-09-24 NOTE — Patient Instructions (Signed)
Medication Instructions:  Your physician has recommended you make the following change in your medication:  1- STOP Imdur. 2- START Amlodipine 5 mg (1 tablet) by mouth once a day.   Labwork: none  Testing/Procedures: none  Follow-Up: Your physician recommends that you schedule a follow-up appointment in: 3 MONTHS WITH DR END.   If you need a refill on your cardiac medications before your next appointment, please call your pharmacy.

## 2017-09-25 ENCOUNTER — Other Ambulatory Visit: Payer: Self-pay | Admitting: Internal Medicine

## 2017-09-25 DIAGNOSIS — I10 Essential (primary) hypertension: Secondary | ICD-10-CM | POA: Insufficient documentation

## 2017-09-25 DIAGNOSIS — R252 Cramp and spasm: Secondary | ICD-10-CM | POA: Insufficient documentation

## 2017-09-26 LAB — BASIC METABOLIC PANEL
BUN/Creatinine Ratio: 15 (ref 9–20)
BUN: 13 mg/dL (ref 6–24)
CALCIUM: 9.4 mg/dL (ref 8.7–10.2)
CHLORIDE: 101 mmol/L (ref 96–106)
CO2: 22 mmol/L (ref 20–29)
Creatinine, Ser: 0.88 mg/dL (ref 0.76–1.27)
GFR calc Af Amer: 111 mL/min/{1.73_m2} (ref >59)
GFR calc non Af Amer: 96 mL/min/{1.73_m2} (ref >59)
Glucose: 72 mg/dL (ref 65–99)
POTASSIUM: 4.1 mmol/L (ref 3.5–5.2)
Sodium: 141 mmol/L (ref 134–144)

## 2017-09-26 LAB — MAGNESIUM: Magnesium: 2 mg/dL (ref 1.6–2.3)

## 2017-10-27 ENCOUNTER — Other Ambulatory Visit: Payer: Self-pay | Admitting: Internal Medicine

## 2017-11-13 ENCOUNTER — Other Ambulatory Visit: Payer: Self-pay | Admitting: *Deleted

## 2017-11-14 MED ORDER — AMLODIPINE BESYLATE 5 MG PO TABS: 5.0000 mg | ORAL_TABLET | Freq: Every day | ORAL | 3 refills | Status: DC

## 2017-12-30 ENCOUNTER — Ambulatory Visit: Payer: 59 | Admitting: Internal Medicine

## 2018-01-09 DIAGNOSIS — M25561 Pain in right knee: Secondary | ICD-10-CM | POA: Diagnosis not present

## 2018-01-09 DIAGNOSIS — M1711 Unilateral primary osteoarthritis, right knee: Secondary | ICD-10-CM | POA: Diagnosis not present

## 2018-01-14 ENCOUNTER — Ambulatory Visit: Payer: 59 | Admitting: Internal Medicine

## 2018-01-20 DIAGNOSIS — M25561 Pain in right knee: Secondary | ICD-10-CM | POA: Diagnosis not present

## 2018-01-30 DIAGNOSIS — M25561 Pain in right knee: Secondary | ICD-10-CM | POA: Diagnosis not present

## 2018-02-04 ENCOUNTER — Telehealth: Payer: Self-pay | Admitting: *Deleted

## 2018-02-04 DIAGNOSIS — M25561 Pain in right knee: Secondary | ICD-10-CM | POA: Diagnosis not present

## 2018-02-04 NOTE — Telephone Encounter (Signed)
Left message for patient to notify them that it is time to schedule annual low dose lung cancer screening CT scan. Instructed patient to call back to verify information prior to the scan being scheduled.  

## 2018-02-05 ENCOUNTER — Telehealth: Payer: Self-pay | Admitting: *Deleted

## 2018-02-05 DIAGNOSIS — Z122 Encounter for screening for malignant neoplasm of respiratory organs: Secondary | ICD-10-CM

## 2018-02-05 DIAGNOSIS — Z87891 Personal history of nicotine dependence: Secondary | ICD-10-CM

## 2018-02-05 NOTE — Telephone Encounter (Signed)
Notified patient that annual lung cancer screening low dose CT scan is due currently or will be in near future. Confirmed that patient is within the age range of 55-77, and asymptomatic, (no signs or symptoms of lung cancer). Patient denies illness that would prevent curative treatment for lung cancer if found. Verified smoking history, (former, quit 11/2017, 57 pack year). The shared decision making visit was done 08/06/16. Patient is agreeable for CT scan being scheduled.

## 2018-02-06 DIAGNOSIS — M1711 Unilateral primary osteoarthritis, right knee: Secondary | ICD-10-CM | POA: Diagnosis not present

## 2018-02-06 DIAGNOSIS — M25561 Pain in right knee: Secondary | ICD-10-CM | POA: Diagnosis not present

## 2018-02-12 DIAGNOSIS — M25561 Pain in right knee: Secondary | ICD-10-CM | POA: Diagnosis not present

## 2018-02-13 ENCOUNTER — Ambulatory Visit
Admission: RE | Admit: 2018-02-13 | Discharge: 2018-02-13 | Disposition: A | Discharge status: Home / Self Care | Payer: BLUE CROSS/BLUE SHIELD | Source: Ambulatory Visit | Attending: Oncology | Admitting: Oncology

## 2018-02-13 DIAGNOSIS — Z87891 Personal history of nicotine dependence: Secondary | ICD-10-CM | POA: Insufficient documentation

## 2018-02-13 DIAGNOSIS — I2510 Atherosclerotic heart disease of native coronary artery without angina pectoris: Secondary | ICD-10-CM | POA: Diagnosis not present

## 2018-02-13 DIAGNOSIS — J438 Other emphysema: Secondary | ICD-10-CM | POA: Insufficient documentation

## 2018-02-13 DIAGNOSIS — Z122 Encounter for screening for malignant neoplasm of respiratory organs: Secondary | ICD-10-CM | POA: Diagnosis not present

## 2018-02-13 DIAGNOSIS — I700 Atherosclerosis of aorta: Secondary | ICD-10-CM | POA: Insufficient documentation

## 2018-02-13 DIAGNOSIS — J432 Centrilobular emphysema: Secondary | ICD-10-CM | POA: Diagnosis not present

## 2018-02-16 ENCOUNTER — Encounter: Payer: Self-pay | Admitting: *Deleted

## 2018-02-18 ENCOUNTER — Other Ambulatory Visit: Payer: Self-pay | Admitting: Sports Medicine

## 2018-02-18 DIAGNOSIS — M25561 Pain in right knee: Secondary | ICD-10-CM

## 2018-02-24 DIAGNOSIS — M25561 Pain in right knee: Secondary | ICD-10-CM | POA: Diagnosis not present

## 2018-02-26 DIAGNOSIS — M25561 Pain in right knee: Secondary | ICD-10-CM | POA: Diagnosis not present

## 2018-03-12 ENCOUNTER — Ambulatory Visit
Admission: RE | Admit: 2018-03-12 | Discharge: 2018-03-12 | Disposition: A | Discharge status: Home / Self Care | Payer: BLUE CROSS/BLUE SHIELD | Source: Ambulatory Visit | Attending: Sports Medicine | Admitting: Sports Medicine

## 2018-03-12 DIAGNOSIS — M948X6 Other specified disorders of cartilage, lower leg: Secondary | ICD-10-CM | POA: Diagnosis not present

## 2018-03-12 DIAGNOSIS — M1711 Unilateral primary osteoarthritis, right knee: Secondary | ICD-10-CM | POA: Insufficient documentation

## 2018-03-12 DIAGNOSIS — M25561 Pain in right knee: Secondary | ICD-10-CM | POA: Diagnosis not present

## 2018-03-13 DIAGNOSIS — M1711 Unilateral primary osteoarthritis, right knee: Secondary | ICD-10-CM | POA: Diagnosis not present

## 2018-03-13 DIAGNOSIS — G8929 Other chronic pain: Secondary | ICD-10-CM | POA: Diagnosis not present

## 2018-03-13 DIAGNOSIS — M25561 Pain in right knee: Secondary | ICD-10-CM | POA: Diagnosis not present

## 2018-03-25 ENCOUNTER — Encounter: Payer: Self-pay | Admitting: Internal Medicine

## 2018-03-25 ENCOUNTER — Ambulatory Visit (INDEPENDENT_AMBULATORY_CARE_PROVIDER_SITE_OTHER): Payer: BLUE CROSS/BLUE SHIELD | Admitting: Internal Medicine

## 2018-03-25 VITALS — BP 140/64 | HR 72 | Ht 70.5 in | Wt 244.5 lb

## 2018-03-25 DIAGNOSIS — I25118 Atherosclerotic heart disease of native coronary artery with other forms of angina pectoris: Secondary | ICD-10-CM

## 2018-03-25 DIAGNOSIS — I10 Essential (primary) hypertension: Secondary | ICD-10-CM | POA: Diagnosis not present

## 2018-03-25 DIAGNOSIS — E785 Hyperlipidemia, unspecified: Secondary | ICD-10-CM | POA: Diagnosis not present

## 2018-03-25 DIAGNOSIS — Z720 Tobacco use: Secondary | ICD-10-CM | POA: Diagnosis not present

## 2018-03-25 NOTE — Progress Notes (Signed)
Follow-up Outpatient Visit Date: 03/25/2018  Primary Care Provider: Cletis Athens, MD [address] [place] [address]  Chief Complaint: Follow-up coronary artery disease  HPI:  Cody Matthews is a 57 y.o. year-old male with history of chest pain (low risk Myoview with coronary artery calcification), COPD, tobacco use, and BPH, who presents for follow-up of chest pain.  Since our last visit in October, Cody Matthews has been doing well.  He occasionally feels like the blood flow in his heart pauses and then resumes.  It is not painful and has been present off and on for years.  It is stable in frequency.  He denies shortness of breath and lightheadedness.  He recent began using his Breo inhaler at night and feels like his nocturnal breathing and sleep are better.  He has rare dependent leg edema.  --------------------------------------------------------------------------------------------------  Cardiovascular History & Procedures: Cardiovascular Problems:  Stable angina with abnormal stress test  Risk Factors:  Tobacco use, male gender, and age > 20  Cath/PCI:  None  CV Surgery:  None  EP Procedures and Devices:  None  Non-Invasive Evaluation(s):  Pharmacologic myocardial perfusion stress test (12/23/16): Low risk study. There is a small in size, mild in severity, fixed defect at the apex, most likely due to apical thinning. LVEF normal.  TTE: Reportedly normal per the patient.  Stress test (10/28/16): Exercise duration of 8 minutes with peak HR 114 bpm. No significant ST segment changes (conflicting reports of chest pain during the study).  Recent CV Pertinent Labs: Lab Results  Component Value Date   K 4.1 09/25/2017   MG 2.0 09/25/2017   BUN 13 09/25/2017   CREATININE 0.88 09/25/2017    Past medical and surgical history were reviewed and updated in EPIC.  Current Meds  Medication Sig  . amLODipine (NORVASC) 5 MG tablet Take 1 tablet (5 mg total) by mouth  daily.  Marland Kitchen aspirin 81 MG tablet Take 81 mg by mouth daily.  Marland Kitchen atorvastatin (LIPITOR) 80 MG tablet Take by mouth daily at 6 PM.   . metoprolol succinate (TOPROL-XL) 100 MG 24 hr tablet Take 100 mg by mouth.   . metroNIDAZOLE (METROCREAM) 0.75 % cream Apply 1 application topically 2 (two) times daily.  . metroNIDAZOLE (METROGEL) 1 % gel APPLY TO AFFECTED AREA TWICE A DAY  . tamsulosin (FLOMAX) 0.4 MG CAPS capsule Take 0.4 mg by mouth daily.    Allergies: Codeine and Penicillins  Social History   Socioeconomic History  . Marital status: Single    Spouse name: Not on file  . Number of children: Not on file  . Years of education: Not on file  . Highest education level: Not on file  Occupational History  . Not on file  Social Needs  . Financial resource strain: Not on file  . Food insecurity:    Worry: Not on file    Inability: Not on file  . Transportation needs:    Medical: Not on file    Non-medical: Not on file  Tobacco Use  . Smoking status: Current Every Day Smoker    Packs/day: 1.00    Years: 38.00    Pack years: 38.00    Types: Cigarettes  . Smokeless tobacco: Never Used  Substance and Sexual Activity  . Alcohol use: Yes    Alcohol/week: 0.0 oz    Comment: Drinks 1 beer every few weeks to month  . Drug use: No  . Sexual activity: Not on file  Lifestyle  . Physical  activity:    Days per week: Not on file    Minutes per session: Not on file  . Stress: Not on file  Relationships  . Social connections:    Talks on phone: Not on file    Gets together: Not on file    Attends religious service: Not on file    Active member of club or organization: Not on file    Attends meetings of clubs or organizations: Not on file    Relationship status: Not on file  . Intimate partner violence:    Fear of current or ex partner: Not on file    Emotionally abused: Not on file    Physically abused: Not on file    Forced sexual activity: Not on file  Other Topics Concern  . Not  on file  Social History Narrative  . Not on file    Family History  Problem Relation Age of Onset  . Breast cancer Mother   . Emphysema Father   . Heart disease Father   . Heart attack Maternal Grandfather 62  . Stroke Paternal Grandmother   . Heart disease Paternal Grandfather        Pacemaker  . Kidney cancer Neg Hx   . Kidney disease Neg Hx   . Prostate cancer Neg Hx     Review of Systems: A 12-system review of systems was performed and was negative except as noted in the HPI.  --------------------------------------------------------------------------------------------------  Physical Exam: BP 140/64 (BP Location: Left Arm, Patient Position: Sitting, Cuff Size: Large)   Pulse 72   Ht 5' 10.5" (1.791 m)   Wt 244 lb 8 oz (110.9 kg)   BMI 34.59 kg/m  Repeat BP: 134/68  General:  NAD HEENT: No conjunctival pallor or scleral icterus. Moist mucous membranes.  OP clear. Neck: Supple without lymphadenopathy, thyromegaly, JVD, or HJR. Lungs: Normal work of breathing. Clear to auscultation bilaterally without wheezes or crackles. Heart: Regular rate and rhythm without murmurs, rubs, or gallops. Non-displaced PMI. Abd: Bowel sounds present. Soft, NT/ND without hepatosplenomegaly Ext: No lower extremity edema. Radial, PT, and DP pulses are 2+ bilaterally. Skin: Warm and dry without rash.  EKG:  NSR with sinus arrhythmia.  Otherwise, normal EKG.  No results found for: WBC, HGB, HCT, MCV, PLT  Lab Results  Component Value Date   NA 141 09/25/2017   K 4.1 09/25/2017   CL 101 09/25/2017   CO2 22 09/25/2017   BUN 13 09/25/2017   CREATININE 0.88 09/25/2017   GLUCOSE 72 09/25/2017    No results found for: CHOL, HDL, LDLCALC, LDLDIRECT, TRIG, CHOLHDL  --------------------------------------------------------------------------------------------------  ASSESSMENT AND PLAN: Coronary artery disease with stable angina No significant chest pain reported.  Brief pauses in blood  flow sound like possible isolated PAC's or PVC's.  There are no associated symptoms.  Given stable symptoms in the setting of coronary artery calcification with low-risk stress test last year, we will continue his current antianginal regimen of amlodipine and metoprolol.  Hypertension BP upper normal to mildly elevated.  Continue current doses of metoprolol and amlodipine.  Lifestyle modifications, including sodium restriction, weight loss, and exercise, were reinforced.  Hyperlipidemia Continue high-intensity statin therapy (atorvastatin 80 mg daily) with ongoing follow-up with Dr. Lavera Guise.  Tobacco abuse Smoking cessation encouraged.  Continue management of COPD per Dr. Lavera Guise.  Follow-up: Return to clinic in 1 year.  Nelva Bush, MD 03/25/2018 2:46 PM

## 2018-03-25 NOTE — Patient Instructions (Signed)
Medication Instructions:  Your physician recommends that you continue on your current medications as directed. Please refer to the Current Medication list given to you today.   Labwork: NONE  Testing/Procedures: NONE  Follow-Up: Your physician wants you to follow-up in: 12 MONTHS WITH DR END. You will receive a reminder letter in the mail two months in advance. If you don't receive a letter, please call our office to schedule the follow-up appointment.  If you need a refill on your cardiac medications before your next appointment, please call your pharmacy.

## 2018-03-26 ENCOUNTER — Encounter: Payer: Self-pay | Admitting: Internal Medicine

## 2018-03-26 DIAGNOSIS — E785 Hyperlipidemia, unspecified: Secondary | ICD-10-CM | POA: Insufficient documentation

## 2018-04-02 DIAGNOSIS — M1711 Unilateral primary osteoarthritis, right knee: Secondary | ICD-10-CM | POA: Diagnosis not present

## 2019-01-09 ENCOUNTER — Other Ambulatory Visit: Payer: Self-pay | Admitting: Internal Medicine

## 2019-02-10 ENCOUNTER — Telehealth: Payer: Self-pay | Admitting: *Deleted

## 2019-02-10 NOTE — Telephone Encounter (Signed)
Left message for patient to notify them that it is time to schedule annual low dose lung cancer screening CT scan. Instructed patient to call back to verify information prior to the scan being scheduled.  

## 2019-02-13 ENCOUNTER — Telehealth: Payer: Self-pay

## 2019-02-13 NOTE — Telephone Encounter (Signed)
Call pt regarding lung screening. Left message to return call. 

## 2019-02-17 ENCOUNTER — Telehealth: Payer: Self-pay | Admitting: *Deleted

## 2019-02-17 NOTE — Telephone Encounter (Signed)
Left message for patient to notify them that it is time to schedule annual low dose lung cancer screening CT scan. Instructed patient to call back to verify information prior to the scan being scheduled.  

## 2019-02-18 ENCOUNTER — Encounter: Payer: Self-pay | Admitting: *Deleted

## 2019-03-04 ENCOUNTER — Encounter: Payer: Self-pay | Admitting: *Deleted

## 2019-04-09 ENCOUNTER — Other Ambulatory Visit: Payer: Self-pay | Admitting: Internal Medicine

## 2019-04-09 NOTE — Telephone Encounter (Signed)
Left voicemail message requesting for patient to call back to schedule one year F/U with Dr. Saunders Revel.

## 2019-04-23 ENCOUNTER — Telehealth: Payer: Self-pay | Admitting: *Deleted

## 2019-04-23 NOTE — Telephone Encounter (Signed)
Contacted regarding lung screening scan scheduling, patient request to consider in June.

## 2019-05-09 ENCOUNTER — Other Ambulatory Visit: Payer: Self-pay | Admitting: Internal Medicine

## 2019-06-01 ENCOUNTER — Telehealth: Payer: Self-pay | Admitting: *Deleted

## 2019-06-01 DIAGNOSIS — Z87891 Personal history of nicotine dependence: Secondary | ICD-10-CM

## 2019-06-01 DIAGNOSIS — Z122 Encounter for screening for malignant neoplasm of respiratory organs: Secondary | ICD-10-CM

## 2019-06-01 NOTE — Telephone Encounter (Signed)
Patient has been notified that annual lung cancer screening low dose CT scan is due currently or will be in near future. Confirmed that patient is within the age range of 55-77, and asymptomatic, (no signs or symptoms of lung cancer). Patient denies illness that would prevent curative treatment for lung cancer if found. Verified smoking history, (former,quit 11/2017, 57 pack year). The shared decision making visit was done 08/06/16. Patient is agreeable for CT scan being scheduled.

## 2019-06-04 ENCOUNTER — Ambulatory Visit
Admission: RE | Admit: 2019-06-04 | Discharge: 2019-06-04 | Disposition: A | Discharge status: Home / Self Care | Payer: Self-pay | Source: Ambulatory Visit | Attending: Oncology | Admitting: Oncology

## 2019-06-04 ENCOUNTER — Other Ambulatory Visit: Payer: Self-pay

## 2019-06-04 DIAGNOSIS — Z122 Encounter for screening for malignant neoplasm of respiratory organs: Secondary | ICD-10-CM

## 2019-06-04 DIAGNOSIS — Z87891 Personal history of nicotine dependence: Secondary | ICD-10-CM

## 2019-06-07 ENCOUNTER — Ambulatory Visit: Admission: RE | Admit: 2019-06-07 | Payer: Self-pay | Source: Ambulatory Visit

## 2019-06-09 DIAGNOSIS — G4733 Obstructive sleep apnea (adult) (pediatric): Secondary | ICD-10-CM | POA: Diagnosis not present

## 2019-06-14 ENCOUNTER — Other Ambulatory Visit: Payer: Self-pay

## 2019-06-14 ENCOUNTER — Ambulatory Visit
Admission: RE | Admit: 2019-06-14 | Discharge: 2019-06-14 | Disposition: A | Discharge status: Home / Self Care | Payer: BC Managed Care – PPO | Source: Ambulatory Visit | Attending: Oncology | Admitting: Oncology

## 2019-06-14 DIAGNOSIS — Z136 Encounter for screening for cardiovascular disorders: Secondary | ICD-10-CM | POA: Diagnosis not present

## 2019-06-14 DIAGNOSIS — Z87891 Personal history of nicotine dependence: Secondary | ICD-10-CM | POA: Insufficient documentation

## 2019-06-14 DIAGNOSIS — Z122 Encounter for screening for malignant neoplasm of respiratory organs: Secondary | ICD-10-CM | POA: Insufficient documentation

## 2019-06-15 ENCOUNTER — Encounter: Payer: Self-pay | Admitting: *Deleted

## 2019-08-03 ENCOUNTER — Other Ambulatory Visit: Payer: Self-pay

## 2019-08-03 DIAGNOSIS — Z20822 Contact with and (suspected) exposure to covid-19: Secondary | ICD-10-CM

## 2019-08-04 LAB — NOVEL CORONAVIRUS, NAA: SARS-CoV-2, NAA: NOT DETECTED

## 2019-08-07 ENCOUNTER — Other Ambulatory Visit: Payer: Self-pay | Admitting: Internal Medicine

## 2019-08-09 NOTE — Telephone Encounter (Signed)
Please schedule overdue F/U with Dr. Saunders Revel. Thank you!

## 2019-08-10 ENCOUNTER — Other Ambulatory Visit: Payer: Self-pay

## 2019-08-10 MED ORDER — AMLODIPINE BESYLATE 5 MG PO TABS: 5.0000 mg | ORAL_TABLET | Freq: Every day | ORAL | 0 refills | Status: DC

## 2019-08-10 NOTE — Telephone Encounter (Signed)
Patient scheduled with End

## 2019-09-17 NOTE — Progress Notes (Signed)
Follow-up Outpatient Visit Date: 09/20/2019  Primary Care Provider: Cletis Athens, [address] [place] [address]  Chief Complaint: Follow-up chest pain  HPI:  [name] is a 58 y.o. year-old male with history of chest pain (low risk Myoview with coronary artery calcification), COPD, tobacco use, and BPH, who presents for follow-up of coronary artery calcification.  I last saw him in 03/2018, at which time he was doing well.  He noted improvement in his breath since being placed on Breo inhaler.  However, he has quit smoking and started using CPAP since our last visit and is no longer requiring the Brio inhaler.  Overall, [name] reports feeling well.  He is somewhat stressed at times, having started a new job in the spring.  He has not had any frank chest pain but continues to feel as if "something a right" in his chest from time to time.  It lasts a few seconds and has been stable for years.  He denies shortness of breath, palpitations, lightheadedness, and edema.  Over the last few months, [name] has noted pain in his hips, especially when lying down.  Interestingly, it is the non-dependent hip, along the lateral aspect of the proximal thigh, which bothers him.  He does not have any pain with ambulation, though he also notes leg cramps in his calves at night from time to time.  He notes that it took a while for a scratch on 1 of his legs to heal, prompting his wife to bring up the possibility of poor circulation.  --------------------------------------------------------------------------------------------------  Cardiovascular History & Procedures: Cardiovascular Problems:  Atypical chest pain  Risk Factors:  Tobacco use, male gender, and age > 22  Cath/PCI:  None  CV Surgery:  None  EP Procedures and Devices:  None  Non-Invasive Evaluation(s):  Pharmacologic myocardial perfusion stress test (12/23/16): Low risk study. There is a small in size, mild in  severity, fixed defect at the apex, most likely due to apical thinning. LVEF normal.  TTE: Reportedly normal per the patient.  Stress test (10/28/16): Exercise duration of 8 minutes with peak HR 114 bpm. No significant ST segment changes (conflicting reports of chest pain during the study).  Recent CV Pertinent Labs: Lab Results  Component Value Date   K 4.1 09/25/2017   MG 2.0 09/25/2017   BUN 13 09/25/2017   CREATININE 0.88 09/25/2017    Past medical and surgical history were reviewed and updated in EPIC.  Current Meds  Medication Sig  . amLODipine (NORVASC) 5 MG tablet Take 1 tablet (5 mg total) by mouth daily.  Marland Kitchen aspirin 81 MG tablet Take 81 mg by mouth daily.  Marland Kitchen atorvastatin (LIPITOR) 80 MG tablet Take by mouth daily at 6 PM.   . fluticasone furoate-vilanterol (BREO ELLIPTA) 100-25 MCG/INH AEPB Inhale 1 puff into the lungs daily.  . metoprolol succinate (TOPROL-XL) 100 MG 24 hr tablet Take 100 mg by mouth.   . metroNIDAZOLE (METROCREAM) 0.75 % cream Apply 1 application topically 2 (two) times daily.  . metroNIDAZOLE (METROGEL) 1 % gel APPLY TO AFFECTED AREA TWICE A DAY  . nitroGLYCERIN (NITROSTAT) 0.4 MG SL tablet Place 1 tablet (0.4 mg total) under the tongue every 5 (five) minutes as needed for chest pain.  . tamsulosin (FLOMAX) 0.4 MG CAPS capsule Take 0.4 mg by mouth daily.    Allergies: Codeine and Penicillins  Social History   Tobacco Use  . Smoking status: Former Smoker    Packs/day: 1.00  Years: 38.00    Pack years: 38.00    Types: Cigarettes    Quit date: 09/05/2018    Years since quitting: 1.0  . Smokeless tobacco: Never Used  Substance Use Topics  . Alcohol use: Yes    Alcohol/week: 0.0 standard drinks    Comment: Drinks 1 beer every few weeks to month  . Drug use: No    Family History  Problem Relation Age of Onset  . Breast cancer Mother   . Emphysema Father   . Heart disease Father   . Heart attack Maternal Grandfather 62  . Stroke Paternal  Grandmother   . Heart disease Paternal Grandfather        Pacemaker  . Kidney cancer Neg Hx   . Kidney disease Neg Hx   . Prostate cancer Neg Hx     Review of Systems: A 12-system review of systems was performed and was negative except as noted in the HPI.  --------------------------------------------------------------------------------------------------  Physical Exam: BP 112/60 (BP Location: Left Arm, Patient Position: Sitting, Cuff Size: Large)   Pulse 61   Temp 98.2 F (36.8 C)   Ht 5\' 10"  (1.778 m)   Wt 246 lb (111.6 kg)   BMI 35.30 kg/m   General: NAD. HEENT: No conjunctival pallor or scleral icterus.  Facemask in place. Neck: Supple without lymphadenopathy, thyromegaly, JVD, or HJR. Lungs: Normal work of breathing. Clear to auscultation bilaterally without wheezes or crackles. Heart: Regular rate and rhythm without murmurs, rubs, or gallops. Non-displaced PMI. Abd: Bowel sounds present. Soft, NT/ND without hepatosplenomegaly Ext: No lower extremity edema. Radial, PT, and DP pulses are 2+ bilaterally.  Normal capillary refill in both feet. Skin: Warm and dry without rash.  No wounds/ulcers on either lower extremity.  EKG: Normal sinus rhythm without abnormality.  No results found for: WBC, HGB, HCT, MCV, PLT  Lab Results  Component Value Date   NA 141 09/25/2017   K 4.1 09/25/2017   CL 101 09/25/2017   CO2 22 09/25/2017   BUN 13 09/25/2017   CREATININE 0.88 09/25/2017   GLUCOSE 72 09/25/2017    No results found for: CHOL, HDL, LDLCALC, LDLDIRECT, TRIG, CHOLHDL  --------------------------------------------------------------------------------------------------  ASSESSMENT AND PLAN: Atypical chest pain: [name] does not have any symptoms to suggest worsening coronary insufficiency.  Prior myocardial perfusion stress test in 12/2016 was low risk with a small apical defect noted, most likely due to apical thinning.  Given continued sporadic atypical chest pain  that has been stable for years, we will defer further work-up.  Hypertension: Blood pressure well controlled.  Continue current medications.  Hyperlipidemia: Patient reports some arthralgias/myalgias over the last few months.  Question if this could be related to statin use.  We will check a lipid panel, CMP, and CBC today.  I recommended that Mr. Duchateau consider a 1 month statin holiday.  He should contact us after a month to let us know if his symptoms have improved.  If so, we may need to consider an alternative agent or de-escalation of atorvastatin.  Leg pain: Nonspecific, predominantly at night.  We have agreed to a 1 month statin holiday.  Symptoms and exam were not consistent with significant PAD.  We discussed utility of obtaining ABIs but have agreed to defer this.  Follow-up: Return to clinic in 1 year.  Nelva Bush, MD 09/20/2019 9:11 AM

## 2019-09-20 ENCOUNTER — Other Ambulatory Visit: Payer: Self-pay

## 2019-09-20 ENCOUNTER — Encounter: Payer: Self-pay | Admitting: Internal Medicine

## 2019-09-20 ENCOUNTER — Ambulatory Visit (INDEPENDENT_AMBULATORY_CARE_PROVIDER_SITE_OTHER): Payer: BC Managed Care – PPO | Admitting: Internal Medicine

## 2019-09-20 VITALS — BP 112/60 | HR 61 | Temp 98.2°F | Ht 70.0 in | Wt 246.0 lb

## 2019-09-20 DIAGNOSIS — G4733 Obstructive sleep apnea (adult) (pediatric): Secondary | ICD-10-CM | POA: Diagnosis not present

## 2019-09-20 DIAGNOSIS — E785 Hyperlipidemia, unspecified: Secondary | ICD-10-CM

## 2019-09-20 DIAGNOSIS — R0789 Other chest pain: Secondary | ICD-10-CM | POA: Diagnosis not present

## 2019-09-20 DIAGNOSIS — M79604 Pain in right leg: Secondary | ICD-10-CM | POA: Diagnosis not present

## 2019-09-20 DIAGNOSIS — I10 Essential (primary) hypertension: Secondary | ICD-10-CM

## 2019-09-20 DIAGNOSIS — Z79899 Other long term (current) drug therapy: Secondary | ICD-10-CM

## 2019-09-20 DIAGNOSIS — M79605 Pain in left leg: Secondary | ICD-10-CM

## 2019-09-20 NOTE — Patient Instructions (Signed)
Medication Instructions:  Your physician recommends that you continue on your current medications as directed. Please refer to the Current Medication list given to you today.  If you need a refill on your cardiac medications before your next appointment, please call your pharmacy.   Lab work: Your physician recommends that you return for lab work in: TODAY at the [facility], CBC, CMET. Please go to the [facility]. You will check in at the front desk to the right as you walk into the atrium. Valet Parking is offered if needed.   If you have labs (blood work) drawn today and your tests are completely normal, you will receive your results only by: Marland Kitchen MyChart Message (if you have MyChart) OR . A paper copy in the mail If you have any lab test that is abnormal or we need to change your treatment, we will call you to review the results.  Testing/Procedures: NONE  Follow-Up: At [facility], you and your health needs are our priority.  As part of our continuing mission to provide you with exceptional heart care, we have created designated Provider Care Teams.  These Care Teams include your primary Cardiologist (physician) and Advanced Practice Providers (APPs -  Physician Assistants and Nurse Practitioners) who all work together to provide you with the care you need, when you need it. You will need a follow up appointment in 12 months.   Please call our office 2 months in advance to schedule this appointment.  You may see DR Harrell Gave END or one of the following Advanced Practice Providers on your designated Care Team:   Murray Hodgkins, NP Christell Faith, PA-C . Marrianne Mood, PA-C

## 2019-09-23 ENCOUNTER — Telehealth: Payer: Self-pay | Admitting: *Deleted

## 2019-09-23 NOTE — Telephone Encounter (Signed)
Patient gave [place] form to return to work.  Job requirements are listed and Dr End needs to review and sign. Placed in Dr Darnelle Bos basket for review.

## 2019-09-27 NOTE — Telephone Encounter (Signed)
Dr End completed and signed form. Faxed form to [phone] [employer]. Called patient to let him know. He asked if I could email it to him. Advised I cannot email it but can mail it to him.  Copy of form mailed to patient.

## 2019-10-19 IMAGING — CT CT CHEST LUNG CANCER SCREENING LOW DOSE
2 of 5 series · 15 of 40 positions shown, 18 images · non-contrast
Comparison: 03/15/2018

CLINICAL DATA: Lung cancer screening. Fifty-seven pack year
history. Former asymptomatic smoker.

EXAM:
CT CHEST WITHOUT CONTRAST LOW-DOSE FOR LUNG CANCER SCREENING
TECHNIQUE: Multidetector CT imaging of the chest was performed following the
standard protocol without IV contrast.

[Series 3: lung · axial · 0.74mm/px · z∈[-1231,-916]mm · 12 of 347 slices shown, 15 images]
[im 16/347  mediastinal]
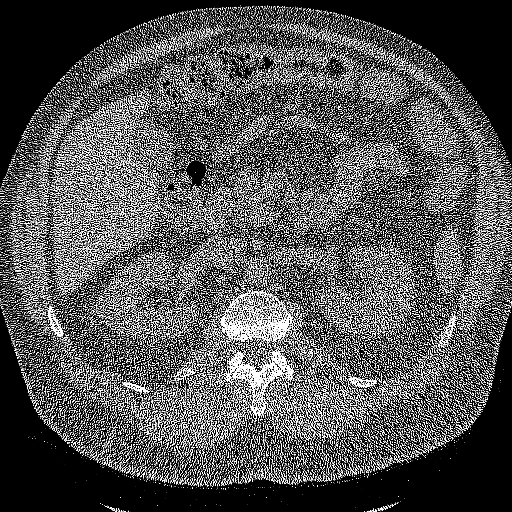
[im 16/347  lung]
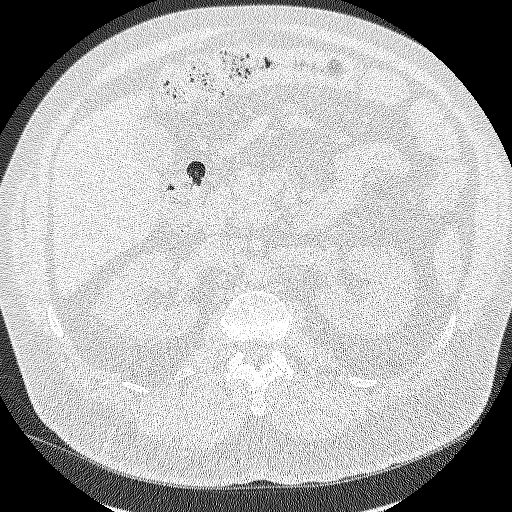
[im 48/347  lung]
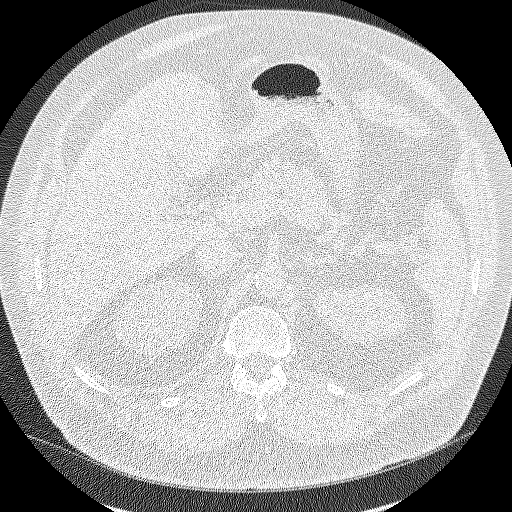
[im 79/347  lung]
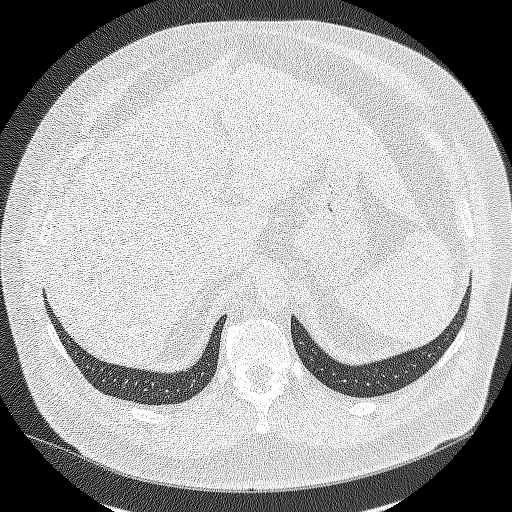
[im 111/347  lung]
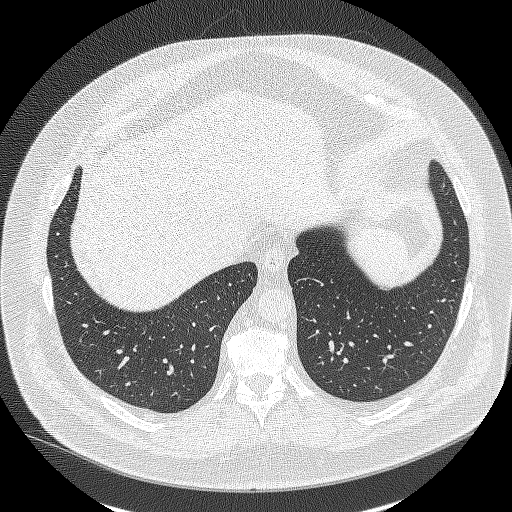
[im 126/347  mediastinal]
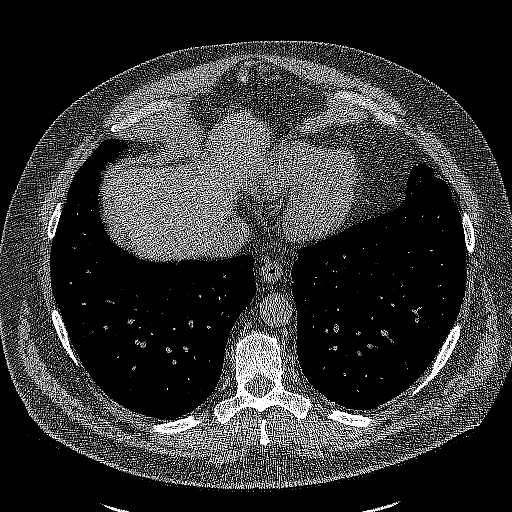
[im 126/347  lung]
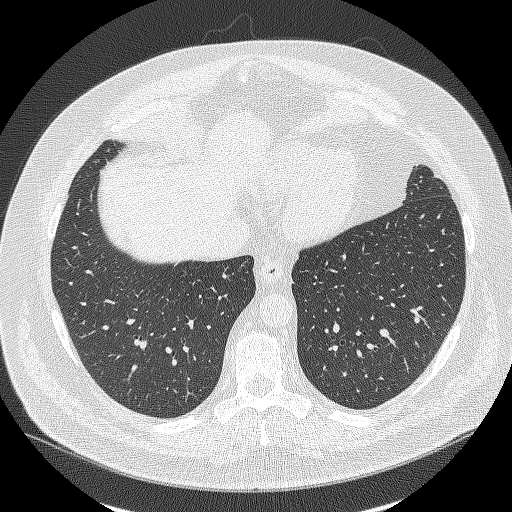
[im 158/347  lung]
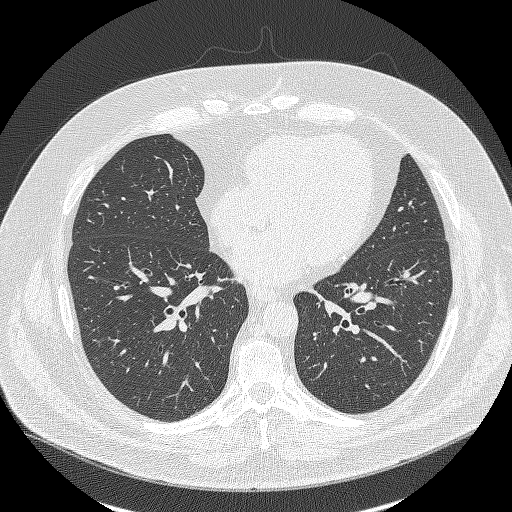
[im 189/347  lung]
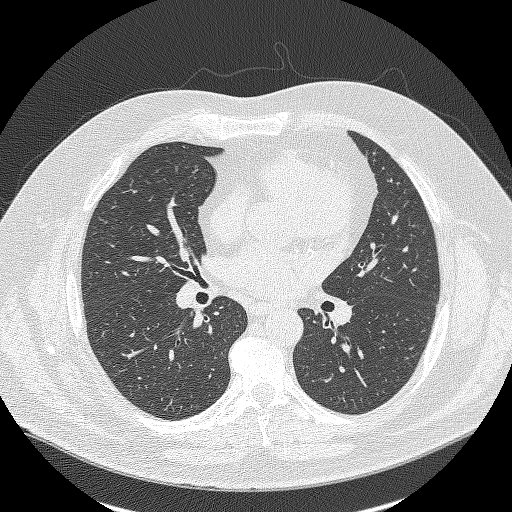
[im 221/347  lung]
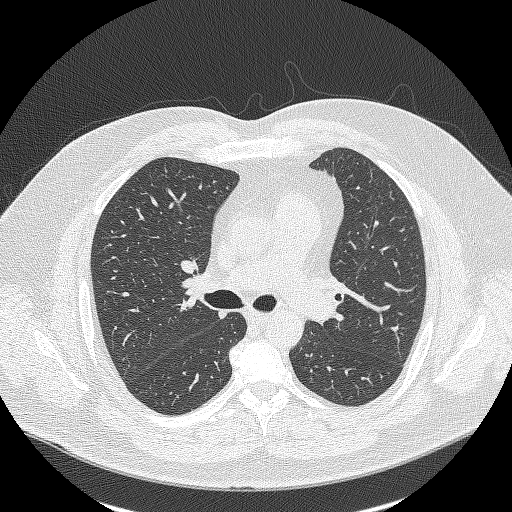
[im 236/347  mediastinal]
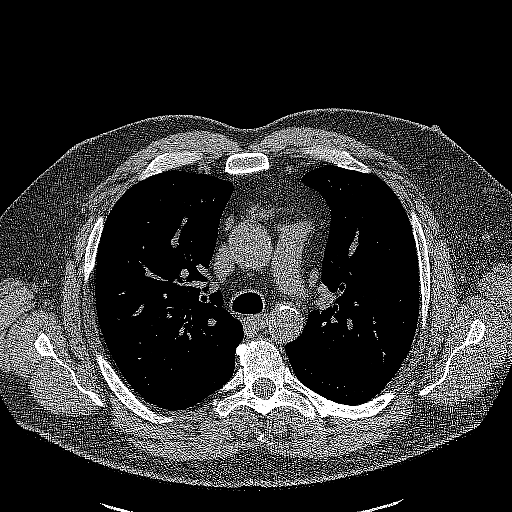
[im 236/347  lung]
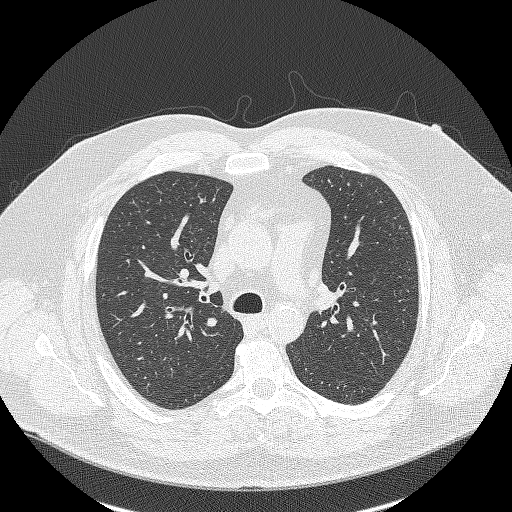
[im 268/347  lung]
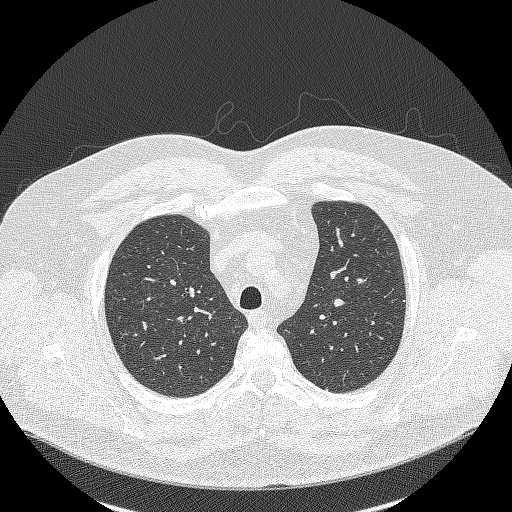
[im 299/347  lung]
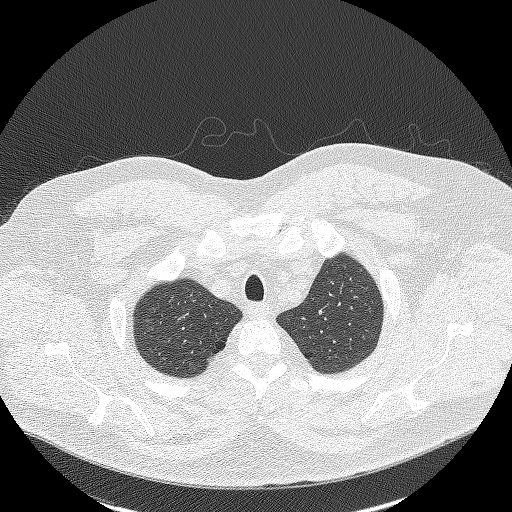
[im 331/347  lung]
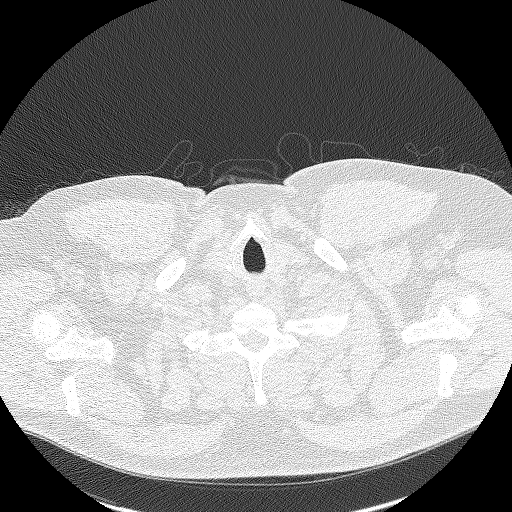

[Series 4: coronal lung · coronal · 0.68mm/px · 3 of 349 slices shown]
[im 70/349  lung]
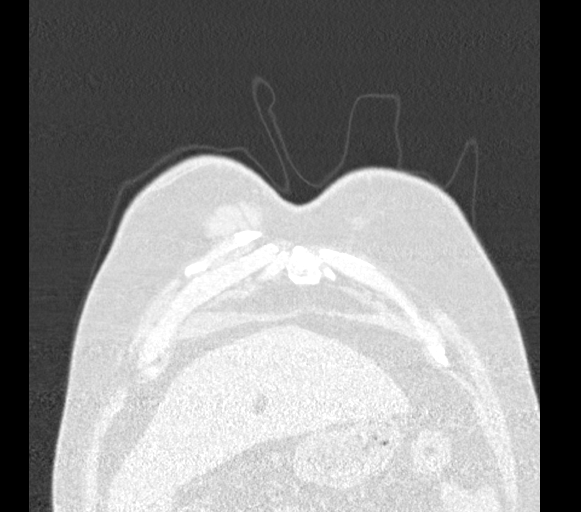
[im 140/349  lung]
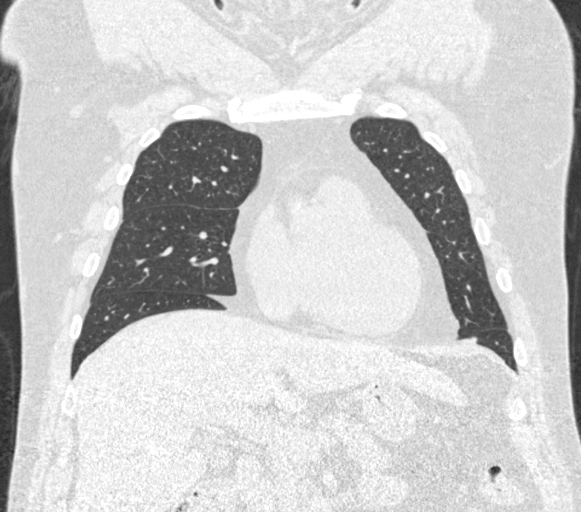
[im 209/349  lung]
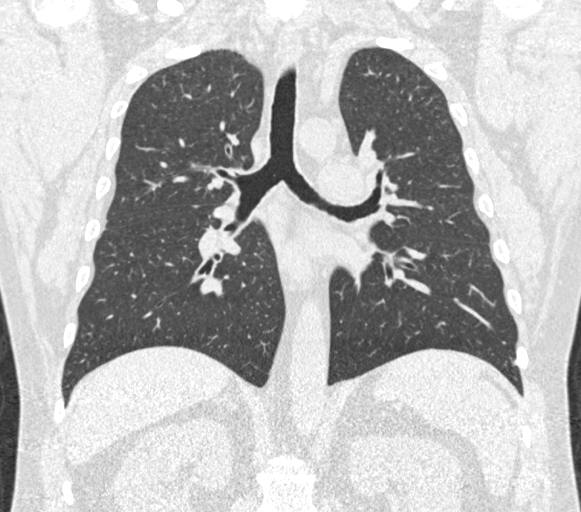

[15 of 40 positions shown; findings below may reference images not displayed]

FINDINGS: Cardiovascular: Aortic atherosclerosis. No aneurysm. Left main, lad,
left circumflex and RCA coronary artery calcifications noted.

Mediastinum/Nodes: No mediastinal or hilar adenopathy identified.
Normal appearance of the thyroid gland. The trachea appears patent
and is midline. Normal appearance of the esophagus.

Lungs/Pleura: No pleural effusion. Mild changes of emphysema. Small
scattered pulmonary nodules are once again seen and appear unchanged
from the previous study. The largest nodule is in the lateral right
lung base with an equivalent diameter of 9.1 cm.

Upper Abdomen: Exophytic cyst arising from upper pole of the right
kidney is incompletely characterized without IV contrast. No acute
abnormality noted.

Musculoskeletal: No aggressive lytic or sclerotic bone lesions.
IMPRESSION: 1. Lung-RADS 2, benign appearance or behavior. Continue annual
screening with low-dose chest CT without contrast in 12 months.
2. Aortic Atherosclerosis (A7C0Y-VCP.P) and Emphysema (A7C0Y-RVS.4).
3. Coronary artery calcifications.

## 2019-11-05 ENCOUNTER — Other Ambulatory Visit: Payer: Self-pay

## 2019-11-05 MED ORDER — AMLODIPINE BESYLATE 5 MG PO TABS: 5.0000 mg | ORAL_TABLET | Freq: Every day | ORAL | 0 refills | Status: DC

## 2019-11-10 ENCOUNTER — Telehealth: Payer: Self-pay

## 2019-11-10 MED ORDER — AMLODIPINE BESYLATE 5 MG PO TABS: 5.0000 mg | ORAL_TABLET | Freq: Every day | ORAL | 2 refills | Status: DC

## 2019-11-10 NOTE — Telephone Encounter (Signed)
Requested Prescriptions   Signed Prescriptions Disp Refills  . amLODipine (NORVASC) 5 MG tablet 90 tablet 2    Sig: Take 1 tablet (5 mg total) by mouth daily.    Authorizing Provider: END, CHRISTOPHER    Ordering User: Raelene Bott, BRANDY L

## 2019-11-10 NOTE — Telephone Encounter (Signed)
Amlodipine Rx sent to [facility] on 11/05/2019. Received fax from [facility] requesting 90 day supply of amlodipine. Left voicemail message requesting to have patient call back to verify which pharmacy he wanted this prescription to.

## 2019-11-10 NOTE — Addendum Note (Signed)
Addended by: Raelene Bott, Gradie Ohm L on: 11/10/2019 11:55 AM   Modules accepted: Orders

## 2019-11-10 NOTE — Telephone Encounter (Signed)
Patient called to clarify pharmacy  New pharmacy is [facility] near [address] [employer]

## 2019-11-17 DIAGNOSIS — R350 Frequency of micturition: Secondary | ICD-10-CM | POA: Diagnosis not present

## 2019-11-17 DIAGNOSIS — N138 Other obstructive and reflux uropathy: Secondary | ICD-10-CM | POA: Diagnosis not present

## 2019-11-17 DIAGNOSIS — N401 Enlarged prostate with lower urinary tract symptoms: Secondary | ICD-10-CM | POA: Diagnosis not present

## 2019-11-17 DIAGNOSIS — Z008 Encounter for other general examination: Secondary | ICD-10-CM | POA: Diagnosis not present

## 2019-11-29 DIAGNOSIS — N411 Chronic prostatitis: Secondary | ICD-10-CM | POA: Diagnosis not present

## 2019-11-29 DIAGNOSIS — M7581 Other shoulder lesions, right shoulder: Secondary | ICD-10-CM | POA: Diagnosis not present

## 2019-11-29 DIAGNOSIS — N138 Other obstructive and reflux uropathy: Secondary | ICD-10-CM | POA: Diagnosis not present

## 2019-11-29 DIAGNOSIS — N401 Enlarged prostate with lower urinary tract symptoms: Secondary | ICD-10-CM | POA: Diagnosis not present

## 2019-12-03 DIAGNOSIS — G4733 Obstructive sleep apnea (adult) (pediatric): Secondary | ICD-10-CM | POA: Diagnosis not present

## 2019-12-21 DIAGNOSIS — M7581 Other shoulder lesions, right shoulder: Secondary | ICD-10-CM | POA: Diagnosis not present

## 2019-12-21 DIAGNOSIS — N138 Other obstructive and reflux uropathy: Secondary | ICD-10-CM | POA: Diagnosis not present

## 2019-12-21 DIAGNOSIS — N411 Chronic prostatitis: Secondary | ICD-10-CM | POA: Diagnosis not present

## 2019-12-21 DIAGNOSIS — N401 Enlarged prostate with lower urinary tract symptoms: Secondary | ICD-10-CM | POA: Diagnosis not present

## 2020-01-12 ENCOUNTER — Encounter: Payer: Self-pay | Admitting: Urology

## 2020-01-12 ENCOUNTER — Ambulatory Visit: Payer: Self-pay | Admitting: Urology

## 2020-03-21 ENCOUNTER — Ambulatory Visit
Admission: RE | Admit: 2020-03-21 | Discharge: 2020-03-21 | Disposition: A | Discharge status: Home / Self Care | Payer: BC Managed Care – PPO | Source: Ambulatory Visit | Attending: Internal Medicine | Admitting: Internal Medicine

## 2020-03-21 ENCOUNTER — Other Ambulatory Visit: Payer: Self-pay | Admitting: Internal Medicine

## 2020-03-21 DIAGNOSIS — M7581 Other shoulder lesions, right shoulder: Secondary | ICD-10-CM | POA: Diagnosis not present

## 2020-03-21 DIAGNOSIS — S59902A Unspecified injury of left elbow, initial encounter: Secondary | ICD-10-CM | POA: Insufficient documentation

## 2020-03-21 DIAGNOSIS — S060X9A Concussion with loss of consciousness of unspecified duration, initial encounter: Secondary | ICD-10-CM | POA: Diagnosis not present

## 2020-03-21 DIAGNOSIS — S5010XA Contusion of unspecified forearm, initial encounter: Secondary | ICD-10-CM | POA: Diagnosis not present

## 2020-04-03 ENCOUNTER — Other Ambulatory Visit: Payer: Self-pay

## 2020-04-03 MED ORDER — AMLODIPINE BESYLATE 5 MG PO TABS: 5.0000 mg | ORAL_TABLET | Freq: Every day | ORAL | 0 refills | Status: DC

## 2020-04-22 ENCOUNTER — Ambulatory Visit
Admission: EM | Admit: 2020-04-22 | Discharge: 2020-04-22 | Disposition: A | Discharge status: Home / Self Care | Payer: Worker's Compensation | Attending: Family Medicine | Admitting: Family Medicine

## 2020-04-22 ENCOUNTER — Other Ambulatory Visit: Payer: Self-pay

## 2020-04-22 DIAGNOSIS — M79672 Pain in left foot: Secondary | ICD-10-CM

## 2020-04-22 MED ORDER — MELOXICAM 15 MG PO TABS: 15.0000 mg | ORAL_TABLET | Freq: Every day | ORAL | 0 refills | Status: DC | PRN

## 2020-04-22 NOTE — Discharge Instructions (Addendum)
Rest, ice, and elevate.  Medication as directed.  May return to work on Monday.  Take care  Dr. Lacinda Axon

## 2020-04-22 NOTE — ED Provider Notes (Signed)
[facility]    CSN: [id number] Arrival date & time: 04/22/20  0802      History   Chief Complaint Chief Complaint  Patient presents with  . Foot Pain    left, NKI   HPI 59 year old male presents with left foot pain.  Per the patient, this is currently being considered a Workmen's Comp. claim despite no known injury.  Patient reports left foot pain particularly on the lateral aspect of the foot.  He states that this started on Wednesday.  Started while he was at work.  Does not recall any fall, trauma, injury.  Rates his pain as 3/10 in severity.  He reports associated swelling but states that the swelling has improved as he has been off of his feet and has been resting.  Currently 3/10 in severity.  No ankle pain.  No other associated symptoms.  Past Medical History:  Diagnosis Date  . Appendicitis   . BPH (benign prostatic hyperplasia)   . Emphysema lung ([place])   . Hernia, incisional   . Hyperlipidemia   . Lung nodules   . Rosacea   . Sprain    rotator cuff  . Wears dentures    full upper and lower    Patient Active Problem List   Diagnosis Date Noted  . Pain in both lower extremities 09/20/2019  . Hyperlipidemia 03/26/2018  . Leg cramps 09/25/2017  . Essential hypertension 09/25/2017  . Atypical chest pain 03/04/2017  . Coronary artery calcification 03/04/2017  . Dyslipidemia 03/04/2017  . Tobacco abuse 03/04/2017  . Personal history of tobacco use, presenting hazards to health 08/12/2016  . Special screening for malignant neoplasms, colon   . Benign neoplasm of descending colon   . Benign neoplasm of sigmoid colon     Past Surgical History:  Procedure Laterality Date  . ABDOMINAL HERNIA REPAIR  2009  . APPENDECTOMY    . CARDIAC CATHETERIZATION  approx 2000   no issues  . COLONOSCOPY WITH PROPOFOL N/A 10/19/2015   Procedure: COLONOSCOPY WITH PROPOFOL;  Surgeon: Lucilla Lame, MD;  Location: [place];  Service: Endoscopy;  Laterality: N/A;   . POLYPECTOMY  10/19/2015   Procedure: POLYPECTOMY;  Surgeon: Lucilla Lame, MD;  Location: [place];  Service: Endoscopy;;  . SHOULDER SURGERY Left 2014, 2015       Home Medications    Prior to Admission medications   Medication Sig Start Date End Date Taking? Authorizing Provider  amLODipine (NORVASC) 5 MG tablet Take 1 tablet (5 mg total) by mouth daily. 04/03/20  Yes End, Harrell Gave, MD  aspirin 81 MG tablet Take 81 mg by mouth daily.   Yes [provider]  atorvastatin (LIPITOR) 80 MG tablet Take by mouth daily at 6 PM.  10/28/16  Yes [provider]  metoprolol succinate (TOPROL-XL) 100 MG 24 hr tablet Take 100 mg by mouth.  10/28/16  Yes [provider]  metroNIDAZOLE (METROCREAM) 0.75 % cream Apply 1 application topically 2 (two) times daily.   Yes [provider]  metroNIDAZOLE (METROGEL) 1 % gel APPLY TO AFFECTED AREA TWICE A DAY 10/02/15  Yes [provider]  tadalafil (CIALIS) 20 MG tablet TK 1 T PO D PRN 08/07/19  Yes [provider]  tamsulosin (FLOMAX) 0.4 MG CAPS capsule Take 0.4 mg by mouth daily. 09/22/15  Yes [provider]  meloxicam (MOBIC) 15 MG tablet Take 1 tablet (15 mg total) by mouth daily as needed for pain. 04/22/20   Thersa Salt  G, DO  nitroGLYCERIN (NITROSTAT) 0.4 MG SL tablet Place 1 tablet (0.4 mg total) under the tongue every 5 (five) minutes as needed for chest pain. 11/13/16 09/20/19  End, Harrell Gave, MD    Family History Family History  Problem Relation Age of Onset  . Breast cancer Mother   . Emphysema Father   . Heart disease Father   . Heart attack Maternal Grandfather 62  . Stroke Paternal Grandmother   . Heart disease Paternal Grandfather        Pacemaker  . Kidney cancer Neg Hx   . Kidney disease Neg Hx   . Prostate cancer Neg Hx     Social History Social History   Tobacco Use  . Smoking status: Former Smoker    Packs/day: 1.00    Years: 38.00    Pack years:  38.00    Types: Cigarettes    Quit date: 09/05/2018    Years since quitting: 1.6  . Smokeless tobacco: Never Used  Substance Use Topics  . Alcohol use: Yes    Alcohol/week: 0.0 standard drinks    Comment: Drinks 1 beer every few weeks to month  . Drug use: No     Allergies   Codeine and Penicillins   Review of Systems Review of Systems  Constitutional: Negative.   Musculoskeletal:       Left foot pain.   Physical Exam Triage Vital Signs ED Triage Vitals  Enc Vitals Group     BP 04/22/20 0828 139/70     Pulse Rate 04/22/20 0828 (!) 54     Resp 04/22/20 0828 18     Temp 04/22/20 0828 98.1 F (36.7 C)     Temp Source 04/22/20 0828 Oral     SpO2 04/22/20 0828 100 %     Weight 04/22/20 0825 242 lb (109.8 kg)     Height 04/22/20 0825 5\' 11"  (1.803 m)     Head Circumference --      Peak Flow --      Pain Score 04/22/20 0825 3     Pain Loc --      Pain Edu? --      Excl. in [place]? --    Updated Vital Signs BP 139/70 (BP Location: Left Arm)   Pulse (!) 54   Temp 98.1 F (36.7 C) (Oral)   Resp 18   Ht 5\' 11"  (1.803 m)   Wt 109.8 kg   SpO2 100%   BMI 33.75 kg/m   Visual Acuity Right Eye Distance:   Left Eye Distance:   Bilateral Distance:    Right Eye Near:   Left Eye Near:    Bilateral Near:     Physical Exam Vitals and nursing note reviewed.  Constitutional:      General: He is not in acute distress.    Appearance: Normal appearance. He is not ill-appearing.  HENT:     Head: Normocephalic and atraumatic.  Eyes:     General:        Right eye: No discharge.        Left eye: No discharge.     Conjunctiva/sclera: Conjunctivae normal.  Cardiovascular:     Rate and Rhythm: Normal rate and regular rhythm.     Heart sounds: No murmur.  Pulmonary:     Effort: Pulmonary effort is normal.     Breath sounds: Normal breath sounds. No wheezing, rhonchi or rales.  Musculoskeletal:     Comments: Left foot and ankle -no significant tenderness on  exam.  No  swelling.  No erythema.  Neurological:     Mental Status: He is alert.  Psychiatric:        Mood and Affect: Mood normal.        Behavior: Behavior normal.    UC Treatments / Results  Labs (all labs ordered are listed, but only abnormal results are displayed) Labs Reviewed - No data to display  EKG   Radiology No results found.  Procedures Procedures (including critical care time)  Medications Ordered in UC Medications - No data to display  Initial Impression / Assessment and Plan / UC Course  I have reviewed the triage vital signs and the nursing notes.  Pertinent labs & imaging results that were available during my care of the patient were reviewed by me and considered in my medical decision making (see chart for details).    59 year old male presents with left foot pain.  I filled out Workmen's Comp. information essentially stating that I do not believe that this is a true Workmen's Comp. claim/injury.  Advised rest, ice, elevation.  Meloxicam as directed.  No indications for imaging.  Final Clinical Impressions(s) / UC Diagnoses   Final diagnoses:  Left foot pain     Discharge Instructions     Rest, ice, and elevate.  Medication as directed.  May return to work on Monday.  Take care  Dr. Lacinda Axon    ED Prescriptions    Medication Sig Dispense Auth. Provider   meloxicam (MOBIC) 15 MG tablet Take 1 tablet (15 mg total) by mouth daily as needed for pain. 30 tablet Coral Spikes, DO     PDMP not reviewed this encounter.   Coral Spikes, [place] 04/22/20 [phone]

## 2020-04-22 NOTE — ED Triage Notes (Addendum)
Pt presents with c/o pain in the left foot. Pt states this started Wednesday while he was at work. Pt denies any known injury to the foot. Pt reports swelling to the entire foot and pain mostly on the outside. Pt states the swelling improves with rest and returns with standing/working. Pt states this is being filed as WC per his employer.

## 2020-06-02 ENCOUNTER — Other Ambulatory Visit: Payer: Self-pay | Admitting: *Deleted

## 2020-06-02 MED ORDER — METOPROLOL SUCCINATE ER 100 MG PO TB24: 100.0000 mg | ORAL_TABLET | Freq: Every day | ORAL | 6 refills | Status: DC

## 2020-06-09 ENCOUNTER — Telehealth: Payer: Self-pay | Admitting: *Deleted

## 2020-06-09 NOTE — Telephone Encounter (Signed)
Patient was notified via VM message that his Low Dose [place] is due. I asked him to call back so we could obtain information before we schedule his CT.

## 2020-06-25 ENCOUNTER — Telehealth: Payer: Self-pay

## 2020-06-25 DIAGNOSIS — Z122 Encounter for screening for malignant neoplasm of respiratory organs: Secondary | ICD-10-CM

## 2020-06-25 DIAGNOSIS — Z87891 Personal history of nicotine dependence: Secondary | ICD-10-CM

## 2020-06-25 NOTE — Telephone Encounter (Signed)
Contacted patient to schedule his annual lung screening CT.  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  I will notify Burgess Estelle, lung navigator of the patient request and ask him to give patient a call.

## 2020-06-26 NOTE — Addendum Note (Signed)
Addended by: Lieutenant Diego on: 06/26/2020 09:14 AM   Modules accepted: Orders

## 2020-06-26 NOTE — Telephone Encounter (Signed)
Smoking history: former, quit 11/2017, 57 pack year.

## 2020-07-17 ENCOUNTER — Ambulatory Visit: Admission: RE | Admit: 2020-07-17 | Payer: Self-pay | Source: Ambulatory Visit

## 2020-07-25 ENCOUNTER — Telehealth: Payer: Self-pay | Admitting: *Deleted

## 2020-07-25 NOTE — Telephone Encounter (Signed)
Per chart review, patient can have annual lung cancer screening CT scan scheduled but will need a Monday at 8:00am appt. Writer phoned patient and left voicemail requesting patient phone [facility] to schedule CT screen.

## 2020-07-26 IMAGING — CR DG ELBOW COMPLETE 3+V*L*
1 series · 4 of 4 positions shown · non-contrast
Comparison: None.

CLINICAL DATA: Blunt trauma to left elbow 2 weeks ago with
persistent pain, initial encounter

EXAM:
LEFT ELBOW - COMPLETE 3+ VIEW

[Series 1: dg elbow complete left (3+view) · 0.14mm/px · 4 of 4 slices shown]
[im 1/4]
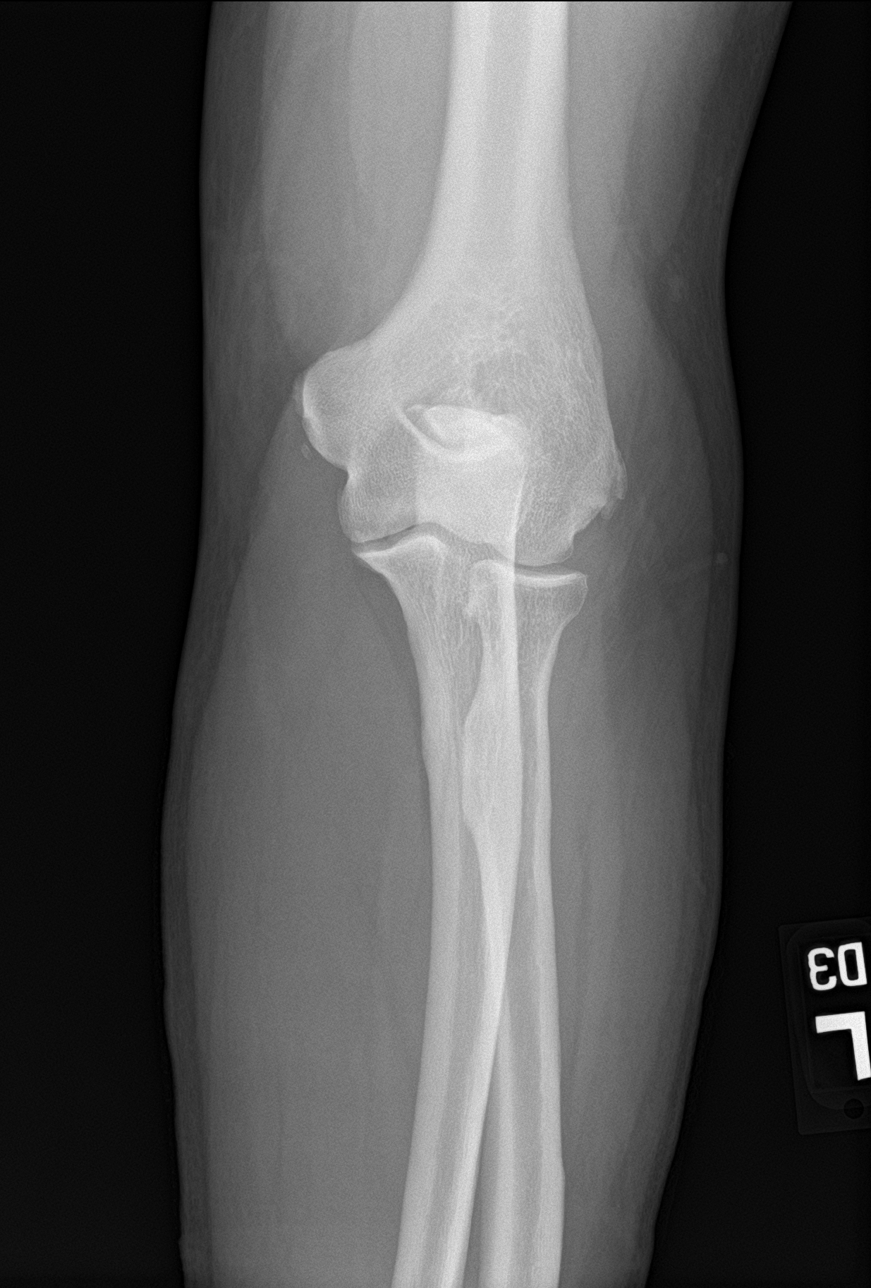
[im 2/4]
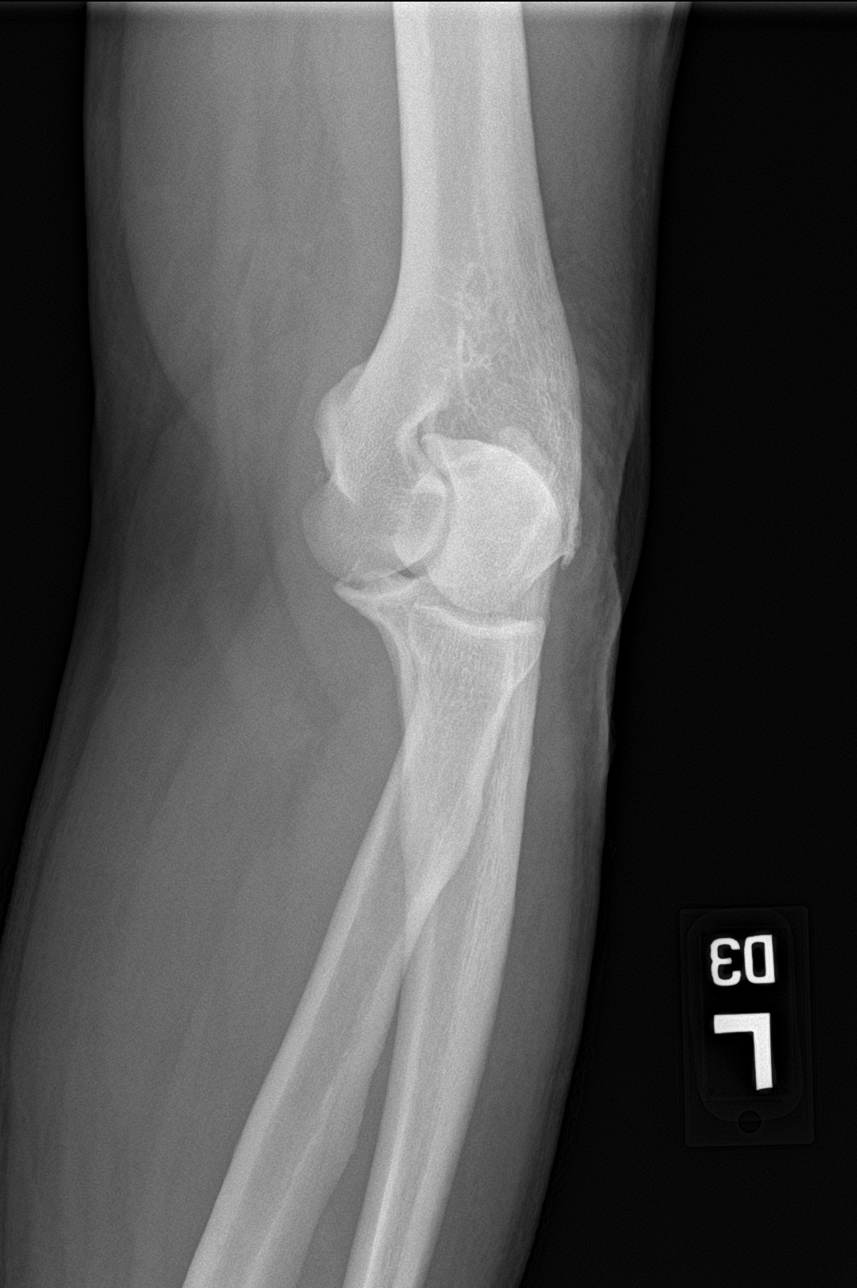
[im 3/4]
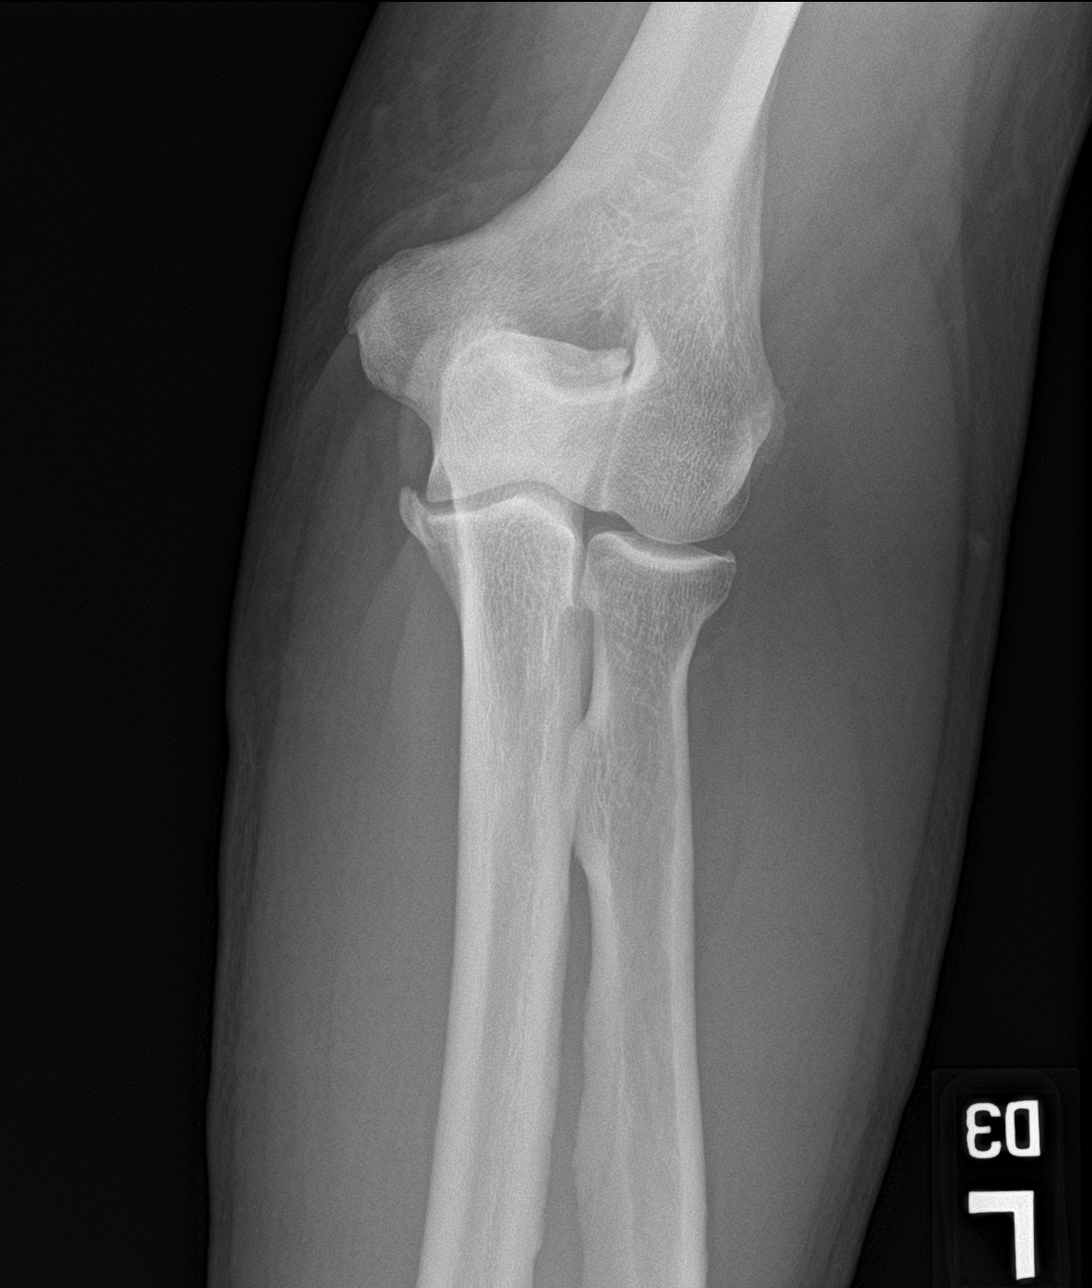
[im 4/4]
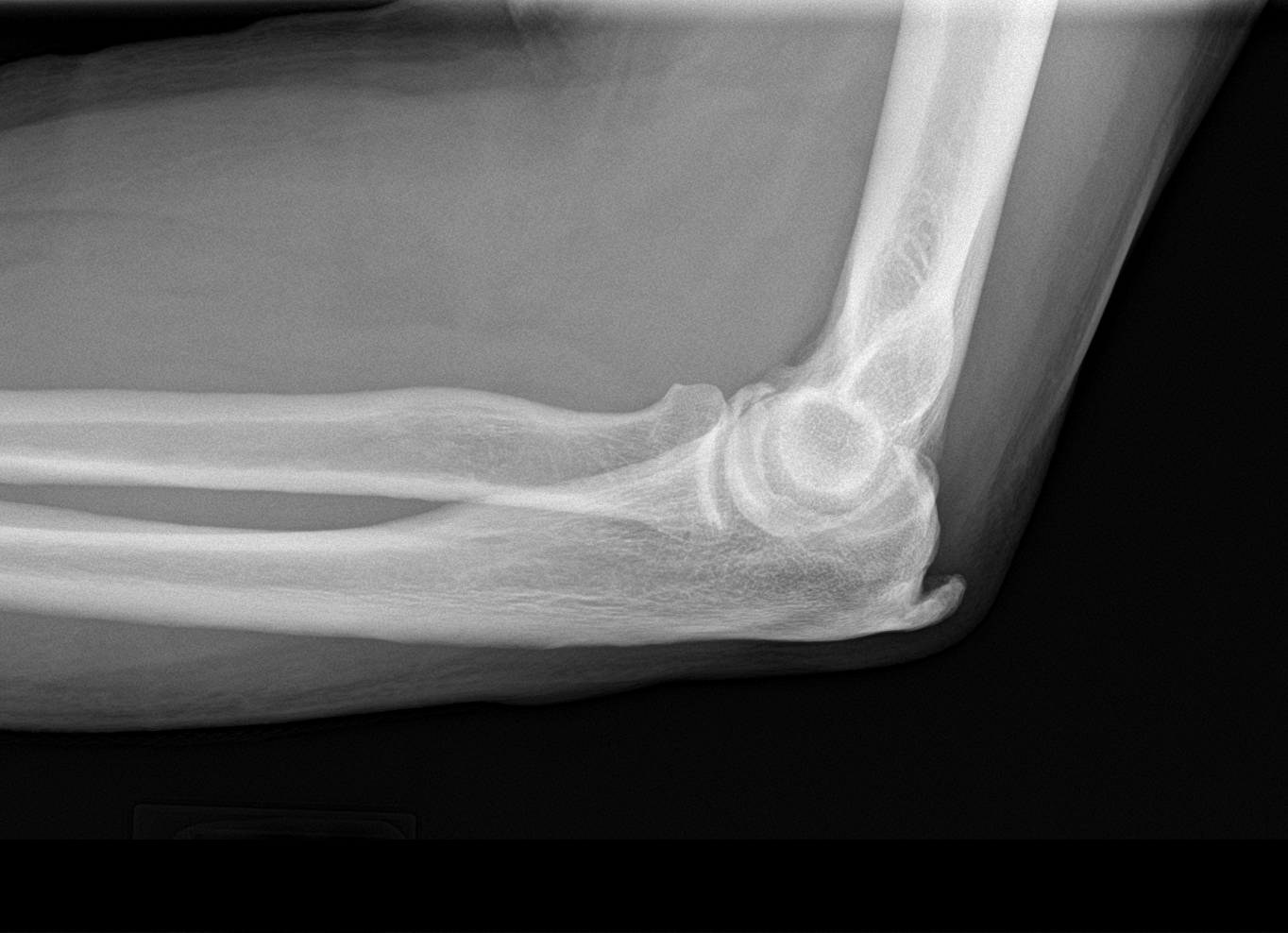

[4 of 4 positions shown; findings below may reference images not displayed]

FINDINGS: Mild degenerative changes are noted with osteophytes arising from
the proximal ulna and radius. Olecranon spurring is noted as well.
No joint effusion is seen. No acute fracture or dislocation is
noted.
IMPRESSION: Mild degenerative change without acute abnormality.

## 2020-08-11 ENCOUNTER — Telehealth: Payer: Self-pay | Admitting: *Deleted

## 2020-08-11 NOTE — Telephone Encounter (Signed)
Patient missed his CT Lung Cancer Screening on 07/17/20. I called him back to reschedule and left a VM. I gave him Cody Matthews Number [phone] to call back.

## 2020-08-26 ENCOUNTER — Telehealth: Payer: Self-pay

## 2020-08-26 NOTE — Telephone Encounter (Signed)
Message left notifying patient that it is time to schedule the low dose lung cancer screening CT scan.  Instructed patient to return call to Shawn Perkins at [phone] to verify information prior to CT scan being scheduled.    

## 2020-10-27 DIAGNOSIS — M25432 Effusion, left wrist: Secondary | ICD-10-CM | POA: Diagnosis not present

## 2020-10-27 DIAGNOSIS — M778 Other enthesopathies, not elsewhere classified: Secondary | ICD-10-CM | POA: Diagnosis not present

## 2020-10-27 DIAGNOSIS — M109 Gout, unspecified: Secondary | ICD-10-CM | POA: Diagnosis not present

## 2020-11-02 ENCOUNTER — Other Ambulatory Visit: Payer: Self-pay

## 2020-11-02 ENCOUNTER — Encounter: Payer: Self-pay | Admitting: Family Medicine

## 2020-11-02 ENCOUNTER — Ambulatory Visit: Payer: BC Managed Care – PPO | Admitting: Family Medicine

## 2020-11-02 VITALS — BP 140/85 | HR 86 | Ht 71.0 in | Wt 242.3 lb

## 2020-11-02 DIAGNOSIS — M10032 Idiopathic gout, left wrist: Secondary | ICD-10-CM | POA: Diagnosis not present

## 2020-11-02 DIAGNOSIS — M109 Gout, unspecified: Secondary | ICD-10-CM | POA: Insufficient documentation

## 2020-11-02 MED ORDER — MELOXICAM 15 MG PO TABS: 15.0000 mg | ORAL_TABLET | Freq: Every day | ORAL | 0 refills | Status: DC

## 2020-11-02 NOTE — Progress Notes (Signed)
Established Patient Office Visit  SUBJECTIVE:  Subjective  Patient ID: Cody Matthews, male    DOB: [birth year]  Age: 59 y.o. MRN: [id number]  CC:  Chief Complaint  Patient presents with  . Wrist Pain    Patient complains of left wrist pain for the past week.Patient was seen at the urgent care and treated for gout and tendenitis.    HPI Cody Matthews is a 59 y.o. male presenting today for     Past Medical History:  Diagnosis Date  . Appendicitis   . BPH (benign prostatic hyperplasia)   . Emphysema lung ([place])   . Hernia, incisional   . Hyperlipidemia   . Lung nodules   . Rosacea   . Sprain    rotator cuff  . Wears dentures    full upper and lower    Past Surgical History:  Procedure Laterality Date  . ABDOMINAL HERNIA REPAIR  2009  . APPENDECTOMY    . CARDIAC CATHETERIZATION  approx 2000   no issues  . COLONOSCOPY WITH PROPOFOL N/A 10/19/2015   Procedure: COLONOSCOPY WITH PROPOFOL;  Surgeon: Lucilla Lame, MD;  Location: [place];  Service: Endoscopy;  Laterality: N/A;  . POLYPECTOMY  10/19/2015   Procedure: POLYPECTOMY;  Surgeon: Lucilla Lame, MD;  Location: [place];  Service: Endoscopy;;  . SHOULDER SURGERY Left 2014, 2015    Family History  Problem Relation Age of Onset  . Breast cancer Mother   . Emphysema Father   . Heart disease Father   . Heart attack Maternal Grandfather 62  . Stroke Paternal Grandmother   . Heart disease Paternal Grandfather        Pacemaker  . Kidney cancer Neg Hx   . Kidney disease Neg Hx   . Prostate cancer Neg Hx     Social History   Socioeconomic History  . Marital status: Single    Spouse name: Not on file  . Number of children: Not on file  . Years of education: Not on file  . Highest education level: Not on file  Occupational History  . Not on file  Tobacco Use  . Smoking status: Former Smoker    Packs/day: 1.00    Years: 38.00    Pack years: 38.00    Types: Cigarettes    Quit date:  09/05/2018    Years since quitting: 2.1  . Smokeless tobacco: Never Used  Vaping Use  . Vaping Use: Never used  Substance and Sexual Activity  . Alcohol use: Yes    Alcohol/week: 0.0 standard drinks    Comment: Drinks 1 beer every few weeks to month  . Drug use: No  . Sexual activity: Not on file  Other Topics Concern  . Not on file  Social History Narrative  . Not on file   Social Determinants of Health   Financial Resource Strain:   . Difficulty of Paying Living Expenses: Not on file  Food Insecurity:   . Worried About Charity fundraiser in the Last Year: Not on file  . Ran Out of Food in the Last Year: Not on file  Transportation Needs:   . Lack of Transportation (Medical): Not on file  . Lack of Transportation (Non-Medical): Not on file  Physical Activity:   . Days of Exercise per Week: Not on file  . Minutes of Exercise per Session: Not on file  Stress:   . Feeling of Stress : Not on file  Social Connections:   .  Frequency of Communication with Friends and Family: Not on file  . Frequency of Social Gatherings with Friends and Family: Not on file  . Attends Religious Services: Not on file  . Active Member of Clubs or Organizations: Not on file  . Attends Archivist Meetings: Not on file  . Marital Status: Not on file  Intimate Partner Violence:   . Fear of Current or Ex-Partner: Not on file  . Emotionally Abused: Not on file  . Physically Abused: Not on file  . Sexually Abused: Not on file     Current Outpatient Medications:  .  amLODipine (NORVASC) 5 MG tablet, Take 1 tablet (5 mg total) by mouth daily., Disp: 90 tablet, Rfl: 0 .  aspirin 81 MG tablet, Take 81 mg by mouth daily., Disp: , Rfl:  .  atorvastatin (LIPITOR) 80 MG tablet, Take by mouth daily at 6 PM. , Disp: , Rfl:  .  meloxicam (MOBIC) 15 MG tablet, Take 1 tablet (15 mg total) by mouth daily as needed for pain., Disp: 30 tablet, Rfl: 0 .  meloxicam (MOBIC) 15 MG tablet, Take 1 tablet (15  mg total) by mouth daily., Disp: 30 tablet, Rfl: 0 .  metoprolol succinate (TOPROL-XL) 100 MG 24 hr tablet, Take 1 tablet (100 mg total) by mouth daily., Disp: 30 tablet, Rfl: 6 .  metroNIDAZOLE (METROCREAM) 0.75 % cream, Apply 1 application topically 2 (two) times daily., Disp: , Rfl:  .  metroNIDAZOLE (METROGEL) 1 % gel, APPLY TO AFFECTED AREA TWICE A DAY, Disp: , Rfl: 0 .  nitroGLYCERIN (NITROSTAT) 0.4 MG SL tablet, Place 1 tablet (0.4 mg total) under the tongue every 5 (five) minutes as needed for chest pain., Disp: 30 tablet, Rfl: 5 .  tadalafil (CIALIS) 20 MG tablet, TK 1 T PO D PRN, Disp: , Rfl:  .  tamsulosin (FLOMAX) 0.4 MG CAPS capsule, Take 0.4 mg by mouth daily., Disp: , Rfl: 5   Allergies  Allergen Reactions  . Codeine Other (See Comments)    HA/migraine  . Penicillins Other (See Comments)    Elevated blood pressure    ROS Review of Systems  Constitutional: Negative.   HENT: Negative.   Respiratory: Negative.   Cardiovascular: Negative.   Gastrointestinal: Negative.   Musculoskeletal: Positive for joint swelling.  Neurological: Negative.   Psychiatric/Behavioral: Negative.   All other systems reviewed and are negative.    OBJECTIVE:    Physical Exam HENT:     Head: Normocephalic.     Right Ear: Tympanic membrane normal.     Mouth/Throat:     Mouth: Mucous membranes are moist.  Eyes:     Pupils: Pupils are equal, round, and reactive to light.  Cardiovascular:     Rate and Rhythm: Normal rate.  Pulmonary:     Effort: Pulmonary effort is normal.  Musculoskeletal:        General: Swelling and tenderness present.  Neurological:     Mental Status: He is alert.  Psychiatric:        Mood and Affect: Mood normal.     BP 140/85   Pulse 86   Ht 5' 11" (1.803 m)   Wt 242 lb 4.8 oz (109.9 kg)   BMI 33.79 kg/m  Wt Readings from Last 3 Encounters:  11/02/20 242 lb 4.8 oz (109.9 kg)  04/22/20 242 lb (109.8 kg)  09/20/19 246 lb (111.6 kg)    Health  Maintenance Due  Topic Date Due  . Hepatitis C Screening  Never done  . COVID-19 Vaccine (1) Never done  . HIV Screening  Never done  . TETANUS/TDAP  Never done  . INFLUENZA VACCINE  Never done  . COLONOSCOPY  10/18/2020    There are no preventive care reminders to display for this patient.  No flowsheet data found. CMP Latest Ref Rng & Units 09/25/2017  Glucose 65 - 99 mg/dL 72  BUN 6 - 24 mg/dL 13  Creatinine 0.76 - 1.27 mg/dL 0.88  Sodium 134 - 144 mmol/L 141  Potassium 3.5 - 5.2 mmol/L 4.1  Chloride 96 - 106 mmol/L 101  CO2 20 - 29 mmol/L 22  Calcium 8.7 - 10.2 mg/dL 9.4    No results found for: TSH No results found for: ALBUMIN, ANIONGAP, EGFR, GFR No results found for: CHOL, HDL, LDLCALC, CHOLHDL No results found for: TRIG No results found for: HGBA1C    ASSESSMENT & PLAN:   Problem List Items Addressed This Visit      Musculoskeletal and Integument   Acute idiopathic gout of left wrist - Primary    Left wrist pain that he went to [facility] in clinic for 5 days ago and was dx with gout with tendonitis. He has been taking Indomethacin with decreased pain today.       Relevant Medications   meloxicam (MOBIC) 15 MG tablet      Meds ordered this encounter  Medications  . meloxicam (MOBIC) 15 MG tablet    Sig: Take 1 tablet (15 mg total) by mouth daily.    Dispense:  30 tablet    Refill:  0      Follow-up: No follow-ups on file.    Beckie Salts, [place] [address], [place], [place] [address]

## 2020-11-02 NOTE — Assessment & Plan Note (Signed)
Left wrist pain that he went to Walk in clinic for 5 days ago and was dx with gout with tendonitis. He has been taking Indomethacin with decreased pain today.

## 2020-11-14 ENCOUNTER — Encounter: Payer: Self-pay | Admitting: Family Medicine

## 2020-11-27 ENCOUNTER — Ambulatory Visit: Payer: BC Managed Care – PPO | Admitting: Physician Assistant

## 2020-12-05 DIAGNOSIS — H2513 Age-related nuclear cataract, bilateral: Secondary | ICD-10-CM | POA: Diagnosis not present

## 2021-01-10 ENCOUNTER — Other Ambulatory Visit: Payer: BC Managed Care – PPO

## 2021-01-10 DIAGNOSIS — Z20822 Contact with and (suspected) exposure to covid-19: Secondary | ICD-10-CM | POA: Diagnosis not present

## 2021-01-11 LAB — NOVEL CORONAVIRUS, NAA: SARS-CoV-2, NAA: NOT DETECTED

## 2021-01-11 LAB — SARS-COV-2, NAA 2 DAY TAT

## 2021-01-16 ENCOUNTER — Encounter: Payer: Self-pay | Admitting: Ophthalmology

## 2021-01-16 ENCOUNTER — Telehealth: Payer: Self-pay | Admitting: Internal Medicine

## 2021-01-16 NOTE — Telephone Encounter (Signed)
   Primary Cardiologist: Dr End  Chart reviewed as part of pre-operative protocol coverage. Cataract extractions are recognized in guidelines as low risk surgeries that do not typically require specific preoperative testing or holding of blood thinner therapy. Therefore, given past medical history and time since last visit, based on ACC/AHA guidelines, Cody Matthews would be at acceptable risk for the planned procedure without further cardiovascular testing.   I will route this recommendation to the requesting party via Epic fax function and remove from pre-op pool.  Please call with questions.  Kerin Ransom, PA-C 01/16/2021, 4:16 PM

## 2021-01-16 NOTE — Telephone Encounter (Signed)
   [facility] Pre-operative Risk Assessment    HEARTCARE STAFF: - Please ensure there is not already an duplicate clearance open for this procedure. - Under Visit Info/Reason for Call, type in Other and utilize the format Clearance MM/DD/YY or Clearance TBD. Do not use dashes or single digits. - If request is for dental extraction, please clarify the # of teeth to be extracted.  Request for surgical clearance:  1. What type of surgery is being performed? Cataract with IOL  2. When is this surgery scheduled? 01/29/21  3. What type of clearance is required (medical clearance vs. Pharmacy clearance to hold med vs. Both)? Not listed  4. Are there any medications that need to be held prior to surgery and how long? Not listed  5. Practice name and name of physician performing surgery? [facility], Dr. Benay Pillow  6. What is the office phone number? [phone]   7.   What is the office fax number? [phone]  8.   Anesthesia type (None, local, MAC, general) ? Not listed   Elissa Hefty 01/16/2021, 3:48 PM  _________________________________________________________________   (provider comments below)

## 2021-01-18 ENCOUNTER — Telehealth: Payer: Self-pay | Admitting: Internal Medicine

## 2021-01-18 ENCOUNTER — Other Ambulatory Visit: Payer: Self-pay | Admitting: *Deleted

## 2021-01-18 MED ORDER — TADALAFIL 20 MG PO TABS: ORAL_TABLET | ORAL | 6 refills | Status: DC

## 2021-01-18 MED ORDER — METOPROLOL SUCCINATE ER 100 MG PO TB24: 100.0000 mg | ORAL_TABLET | Freq: Every day | ORAL | 6 refills | Status: DC

## 2021-01-18 MED ORDER — AMLODIPINE BESYLATE 5 MG PO TABS: 5.0000 mg | ORAL_TABLET | Freq: Every day | ORAL | 0 refills | Status: DC

## 2021-01-18 MED ORDER — TAMSULOSIN HCL 0.4 MG PO CAPS: 0.4000 mg | ORAL_CAPSULE | Freq: Every day | ORAL | 5 refills | Status: DC

## 2021-01-18 MED ORDER — ATORVASTATIN CALCIUM 80 MG PO TABS: 80.0000 mg | ORAL_TABLET | Freq: Every day | ORAL | 0 refills | Status: DC

## 2021-01-18 NOTE — Telephone Encounter (Signed)
One month supply sent with message to keep upcoming appointment for further refills.

## 2021-01-18 NOTE — Telephone Encounter (Signed)
*  STAT* If patient is at the pharmacy, call can be transferred to refill team.   1. Which medications need to be refilled? (please list name of each medication and dose if known) atorvastatin  2. Which pharmacy/location (including street and city if local pharmacy) is medication to be sent to? [facility] in [place]  3. Do they need a 30 day or 90 day supply? 90  Patient scheduled with Urban Gibson on 2/17

## 2021-01-22 ENCOUNTER — Other Ambulatory Visit: Payer: Self-pay | Admitting: *Deleted

## 2021-01-22 DIAGNOSIS — H2511 Age-related nuclear cataract, right eye: Secondary | ICD-10-CM | POA: Diagnosis not present

## 2021-01-22 MED ORDER — ATORVASTATIN CALCIUM 80 MG PO TABS: 80.0000 mg | ORAL_TABLET | Freq: Every day | ORAL | 0 refills | Status: DC

## 2021-01-25 ENCOUNTER — Other Ambulatory Visit
Admission: RE | Admit: 2021-01-25 | Discharge: 2021-01-25 | Disposition: A | Discharge status: Home / Self Care | Payer: BC Managed Care – PPO | Source: Ambulatory Visit | Attending: Ophthalmology | Admitting: Ophthalmology

## 2021-01-25 ENCOUNTER — Other Ambulatory Visit: Payer: Self-pay

## 2021-01-25 DIAGNOSIS — Z20822 Contact with and (suspected) exposure to covid-19: Secondary | ICD-10-CM | POA: Insufficient documentation

## 2021-01-25 DIAGNOSIS — Z01812 Encounter for preprocedural laboratory examination: Secondary | ICD-10-CM | POA: Diagnosis not present

## 2021-01-25 LAB — SARS CORONAVIRUS 2 (TAT 6-24 HRS): SARS Coronavirus 2: NEGATIVE

## 2021-01-26 NOTE — Discharge Instructions (Signed)

## 2021-01-29 ENCOUNTER — Other Ambulatory Visit: Payer: Self-pay

## 2021-01-29 ENCOUNTER — Ambulatory Visit
Admission: RE | Admit: 2021-01-29 | Discharge: 2021-01-29 | Disposition: A | Discharge status: Home / Self Care | Payer: BC Managed Care – PPO | Attending: Ophthalmology | Admitting: Ophthalmology

## 2021-01-29 ENCOUNTER — Encounter: Payer: Self-pay | Admitting: Ophthalmology

## 2021-01-29 ENCOUNTER — Ambulatory Visit: Payer: BC Managed Care – PPO | Admitting: Anesthesiology

## 2021-01-29 ENCOUNTER — Encounter
Admission: RE | Disposition: A | Discharge status: Home / Self Care | Payer: Self-pay | Source: Home / Self Care | Attending: Ophthalmology

## 2021-01-29 DIAGNOSIS — H2511 Age-related nuclear cataract, right eye: Secondary | ICD-10-CM | POA: Diagnosis not present

## 2021-01-29 DIAGNOSIS — Z79899 Other long term (current) drug therapy: Secondary | ICD-10-CM | POA: Diagnosis not present

## 2021-01-29 DIAGNOSIS — Z87891 Personal history of nicotine dependence: Secondary | ICD-10-CM | POA: Diagnosis not present

## 2021-01-29 DIAGNOSIS — Z885 Allergy status to narcotic agent status: Secondary | ICD-10-CM | POA: Insufficient documentation

## 2021-01-29 DIAGNOSIS — Z880 Allergy status to penicillin: Secondary | ICD-10-CM | POA: Diagnosis not present

## 2021-01-29 DIAGNOSIS — N400 Enlarged prostate without lower urinary tract symptoms: Secondary | ICD-10-CM | POA: Insufficient documentation

## 2021-01-29 DIAGNOSIS — E785 Hyperlipidemia, unspecified: Secondary | ICD-10-CM | POA: Diagnosis not present

## 2021-01-29 DIAGNOSIS — Z9049 Acquired absence of other specified parts of digestive tract: Secondary | ICD-10-CM | POA: Diagnosis not present

## 2021-01-29 DIAGNOSIS — H25811 Combined forms of age-related cataract, right eye: Secondary | ICD-10-CM | POA: Diagnosis not present

## 2021-01-29 HISTORY — PX: CATARACT EXTRACTION W/PHACO: SHX586

## 2021-01-29 SURGERY — PHACOEMULSIFICATION, CATARACT, WITH IOL INSERTION
Anesthesia: Monitor Anesthesia Care | Site: Eye | Laterality: Right

## 2021-01-29 MED ORDER — FENTANYL CITRATE (PF) 100 MCG/2ML IJ SOLN
INTRAMUSCULAR | Status: DC | PRN
  Administered 2021-01-29 (×2): 50 ug via INTRAVENOUS

## 2021-01-29 MED ORDER — TETRACAINE HCL 0.5 % OP SOLN
1.0000 [drp] | OPHTHALMIC | Status: DC | PRN
  Administered 2021-01-29 (×3): 1 [drp] via OPHTHALMIC

## 2021-01-29 MED ORDER — MIDAZOLAM HCL 2 MG/2ML IJ SOLN
INTRAMUSCULAR | Status: DC | PRN
  Administered 2021-01-29: 2 mg via INTRAVENOUS

## 2021-01-29 MED ORDER — SODIUM HYALURONATE 10 MG/ML IO SOLN
INTRAOCULAR | Status: DC | PRN
  Administered 2021-01-29: 0.55 mL via INTRAOCULAR

## 2021-01-29 MED ORDER — LIDOCAINE HCL (PF) 2 % IJ SOLN
INTRAOCULAR | Status: DC | PRN
  Administered 2021-01-29: 1 mL via INTRAOCULAR

## 2021-01-29 MED ORDER — ARMC OPHTHALMIC DILATING DROPS
1.0000 "application " | OPHTHALMIC | Status: DC | PRN
  Administered 2021-01-29 (×3): 1 via OPHTHALMIC

## 2021-01-29 MED ORDER — SODIUM HYALURONATE 23 MG/ML IO SOLN
INTRAOCULAR | Status: DC | PRN
  Administered 2021-01-29: 0.6 mL via INTRAOCULAR

## 2021-01-29 MED ORDER — EPINEPHRINE PF 1 MG/ML IJ SOLN
INTRAOCULAR | Status: DC | PRN
  Administered 2021-01-29: 66 mL via OPHTHALMIC

## 2021-01-29 MED ORDER — MOXIFLOXACIN HCL 0.5 % OP SOLN
OPHTHALMIC | Status: DC | PRN
  Administered 2021-01-29: 0.2 mL via OPHTHALMIC

## 2021-01-29 SURGICAL SUPPLY — 19 items

## 2021-01-29 NOTE — Anesthesia Preprocedure Evaluation (Signed)
Anesthesia Evaluation  Patient identified by MRN, date of birth, ID band Patient awake    Reviewed: Allergy & Precautions, NPO status , Patient's Chart, lab work & pertinent test results  Airway Mallampati: II  TM Distance: >3 FB     Dental no notable dental hx.    Pulmonary COPD, Patient abstained from smoking., former smoker,    Pulmonary exam normal        Cardiovascular hypertension, + CAD  Normal cardiovascular exam     Neuro/Psych negative neurological ROS  negative psych ROS   GI/Hepatic negative GI ROS, Neg liver ROS,   Endo/Other  negative endocrine ROS  Renal/GU negative Renal ROS  negative genitourinary   Musculoskeletal  (+) Arthritis , Osteoarthritis,    Abdominal (+) + obese,   Peds  Hematology negative hematology ROS (+)   Anesthesia Other Findings   Reproductive/Obstetrics                             Anesthesia Physical Anesthesia Plan  ASA: III  Anesthesia Plan: MAC   Post-op Pain Management:    Induction: Intravenous  PONV Risk Score and Plan: 1 and Midazolam, TIVA and Treatment may vary due to age or medical condition  Airway Management Planned: Natural Airway and Nasal Cannula  Additional Equipment: None  Intra-op Plan:   Post-operative Plan:   Informed Consent: I have reviewed the patients History and Physical, chart, labs and discussed the procedure including the risks, benefits and alternatives for the proposed anesthesia with the patient or authorized representative who has indicated his/her understanding and acceptance.     Dental advisory given  Plan Discussed with: CRNA  Anesthesia Plan Comments:         Anesthesia Quick Evaluation

## 2021-01-29 NOTE — Anesthesia Postprocedure Evaluation (Signed)
Anesthesia Post Note  Patient: Cody Matthews  Procedure(s) Performed: CATARACT EXTRACTION PHACO AND INTRAOCULAR LENS PLACEMENT (IOC) RIGHT 3.86 00:30.4 (Right Eye)     Patient location during evaluation: PACU Anesthesia Type: MAC Level of consciousness: awake and alert Pain management: pain level controlled Vital Signs Assessment: post-procedure vital signs reviewed and stable Respiratory status: nonlabored ventilation and spontaneous breathing Cardiovascular status: blood pressure returned to baseline Postop Assessment: no apparent nausea or vomiting Anesthetic complications: no   No complications documented.  Calen Posch [employer]

## 2021-01-29 NOTE — Anesthesia Procedure Notes (Signed)
Procedure Name: MAC Date/Time: 01/29/2021 9:20 AM Performed by: Jeannene Patella, CRNA Pre-anesthesia Checklist: Patient identified, Emergency Drugs available, Suction available, Timeout performed and Patient being monitored Patient Re-evaluated:Patient Re-evaluated prior to induction Oxygen Delivery Method: Nasal cannula Placement Confirmation: positive ETCO2

## 2021-01-29 NOTE — H&P (Signed)
[facility]   Primary Care Physician:  Cletis Athens, MD Ophthalmologist: Dr. Benay Pillow  Pre-Procedure History & Physical: HPI:  BODE PIEPER is a 60 y.o. male here for cataract surgery.   Past Medical History:  Diagnosis Date  . Appendicitis   . BPH (benign prostatic hyperplasia)   . Emphysema lung ([place])   . Hernia, incisional   . Hyperlipidemia   . Lung nodules   . Rosacea   . Sprain    rotator cuff  . Wears dentures    full upper and lower    Past Surgical History:  Procedure Laterality Date  . ABDOMINAL HERNIA REPAIR  2009  . APPENDECTOMY    . CARDIAC CATHETERIZATION  approx 2000   no issues  . COLONOSCOPY WITH PROPOFOL N/A 10/19/2015   Procedure: COLONOSCOPY WITH PROPOFOL;  Surgeon: Lucilla Lame, MD;  Location: [facility] [place];  Service: Endoscopy;  Laterality: N/A;  . POLYPECTOMY  10/19/2015   Procedure: POLYPECTOMY;  Surgeon: Lucilla Lame, MD;  Location: [place];  Service: Endoscopy;;  . SHOULDER SURGERY Left 2014, 2015    Prior to Admission medications   Medication Sig Start Date End Date Taking? Authorizing Provider  amLODipine (NORVASC) 5 MG tablet Take 1 tablet (5 mg total) by mouth daily. 01/18/21  Yes Beckie Salts, FNP  atorvastatin (LIPITOR) 80 MG tablet Take 1 tablet (80 mg total) by mouth daily at 6 PM. Please keep upcoming appointment for any further refills. 01/22/21  Yes Masoud, Viann Shove, MD  meloxicam (MOBIC) 15 MG tablet Take 1 tablet (15 mg total) by mouth daily as needed for pain. 04/22/20  Yes Cook, Jayce G, DO  meloxicam (MOBIC) 15 MG tablet Take 1 tablet (15 mg total) by mouth daily. 11/02/20  Yes Beckie Salts, FNP  metoprolol succinate (TOPROL-XL) 100 MG 24 hr tablet Take 1 tablet (100 mg total) by mouth daily. 01/18/21  Yes Beckie Salts, FNP  metroNIDAZOLE (METROCREAM) 0.75 % cream Apply 1 application topically 2 (two) times daily.   Yes [provider]  tamsulosin (FLOMAX) 0.4 MG CAPS capsule Take 1 capsule (0.4 mg  total) by mouth daily. 01/18/21  Yes Beckie Salts, FNP  aspirin 81 MG tablet Take 81 mg by mouth daily. Patient not taking: Reported on 01/16/2021    [provider]  metroNIDAZOLE (METROGEL) 1 % gel APPLY TO AFFECTED AREA TWICE A DAY 10/02/15   [provider]  nitroGLYCERIN (NITROSTAT) 0.4 MG SL tablet Place 1 tablet (0.4 mg total) under the tongue every 5 (five) minutes as needed for chest pain. 11/13/16 09/20/19  End, Harrell Gave, MD  tadalafil (CIALIS) 20 MG tablet TK 1 T PO D PRN 01/18/21   Beckie Salts, FNP    Allergies as of 12/13/2020 - Review Complete 11/14/2020  Allergen Reaction Noted  . Codeine Other (See Comments) 10/09/2015  . Penicillins Other (See Comments) 10/13/2015    Family History  Problem Relation Age of Onset  . Breast cancer Mother   . Emphysema Father   . Heart disease Father   . Heart attack Maternal Grandfather 62  . Stroke Paternal Grandmother   . Heart disease Paternal Grandfather        Pacemaker  . Kidney cancer Neg Hx   . Kidney disease Neg Hx   . Prostate cancer Neg Hx     Social History   Socioeconomic History  . Marital status: Single    Spouse name: Not on file  . Number of children: Not on file  .  Years of education: Not on file  . Highest education level: Not on file  Occupational History  . Not on file  Tobacco Use  . Smoking status: Former Smoker    Packs/day: 1.00    Years: 38.00    Pack years: 38.00    Types: Cigarettes    Quit date: 09/05/2018    Years since quitting: 2.4  . Smokeless tobacco: Never Used  Vaping Use  . Vaping Use: Never used  Substance and Sexual Activity  . Alcohol use: Yes    Alcohol/week: 0.0 standard drinks    Comment: Drinks 1 beer every few weeks to month  . Drug use: No  . Sexual activity: Not on file  Other Topics Concern  . Not on file  Social History Narrative  . Not on file   Social Determinants of Health   Financial Resource Strain: Not on file  Food Insecurity: Not on  file  Transportation Needs: Not on file  Physical Activity: Not on file  Stress: Not on file  Social Connections: Not on file  Intimate Partner Violence: Not on file    Review of Systems: See HPI, otherwise negative ROS  Physical Exam: BP 136/67   Pulse 67   Temp (!) 97.3 F (36.3 C) (Temporal)   Resp 18   Ht 5\' 11"  (1.803 m)   Wt 112.5 kg   SpO2 100%   BMI 34.59 kg/m  General:   Alert,  pleasant and cooperative in NAD Head:  Normocephalic and atraumatic. Respiratory:  Normal work of breathing.  Impression/Plan: Lenora Boys is here for cataract surgery.  Risks, benefits, limitations, and alternatives regarding cataract surgery have been reviewed with the patient.  Questions have been answered.  All parties agreeable.   Benay Pillow, MD  01/29/2021, 9:05 AM

## 2021-01-29 NOTE — Transfer of Care (Signed)
Immediate Anesthesia Transfer of Care Note  Patient: Cody Matthews  Procedure(s) Performed: CATARACT EXTRACTION PHACO AND INTRAOCULAR LENS PLACEMENT (IOC) RIGHT 3.86 00:30.4 (Right Eye)  Patient Location: PACU  Anesthesia Type: MAC  Level of Consciousness: awake, alert  and patient cooperative  Airway and Oxygen Therapy: Patient Spontanous Breathing and Patient connected to supplemental oxygen  Post-op Assessment: Post-op Vital signs reviewed, Patient's Cardiovascular Status Stable, Respiratory Function Stable, Patent Airway and No signs of Nausea or vomiting  Post-op Vital Signs: Reviewed and stable  Complications: No complications documented.

## 2021-01-29 NOTE — Op Note (Signed)
OPERATIVE NOTE  [facility] [id number] 01/29/2021   PREOPERATIVE DIAGNOSIS:  Nuclear sclerotic cataract right eye.  H25.11   POSTOPERATIVE DIAGNOSIS:    Nuclear sclerotic cataract right eye.     PROCEDURE:  Phacoemusification with posterior chamber intraocular lens placement of the right eye   LENS:   Implant Name Type Inv. Item Serial No. Manufacturer Lot No. LRB No. Used Action  LENS IOL TECNIS EYHANCE 23.5 - [id number] Intraocular Lens LENS IOL TECNIS EYHANCE 23.5 [id number]   Right 1 Implanted       Procedure(s): CATARACT EXTRACTION PHACO AND INTRAOCULAR LENS PLACEMENT (IOC) RIGHT 3.86 00:30.4 (Right)  DIB00 +23.5   ULTRASOUND TIME: 0 minutes 30 seconds.  CDE 3.86   SURGEON:  Benay Pillow, MD, MPH  ANESTHESIOLOGIST: Anesthesiologist: Ardeth Sportsman, MD CRNA: Jeannene Patella, CRNA   ANESTHESIA:  Topical with tetracaine drops augmented with 1% preservative-free intracameral lidocaine.  ESTIMATED BLOOD LOSS: less than 1 mL.   COMPLICATIONS:  None.   DESCRIPTION OF PROCEDURE:  The patient was identified in the holding room and transported to the operating room and placed in the supine position under the operating microscope.  The right eye was identified as the operative eye and it was prepped and draped in the usual sterile ophthalmic fashion.   A 1.0 millimeter clear-corneal paracentesis was made at the 10:30 position. 0.5 ml of preservative-free 1% lidocaine with epinephrine was injected into the anterior chamber.  The anterior chamber was filled with Healon 5 viscoelastic.  A 2.4 millimeter keratome was used to make a near-clear corneal incision at the 8:00 position.  A curvilinear capsulorrhexis was made with a cystotome and capsulorrhexis forceps.  Balanced salt solution was used to hydrodissect and hydrodelineate the nucleus.   Phacoemulsification was then used in stop and chop fashion to remove the lens nucleus and epinucleus.  The remaining cortex was  then removed using the irrigation and aspiration handpiece. Healon was then placed into the capsular bag to distend it for lens placement.  A lens was then injected into the capsular bag.  The remaining viscoelastic was aspirated.   Wounds were hydrated with balanced salt solution.  The anterior chamber was inflated to a physiologic pressure with balanced salt solution.   Intracameral vigamox 0.1 mL undiluted was injected into the eye and a drop placed onto the ocular surface.  No wound leaks were noted.  The patient was taken to the recovery room in stable condition without complications of anesthesia or surgery  Benay Pillow 01/29/2021, 9:37 AM

## 2021-01-30 ENCOUNTER — Encounter: Payer: Self-pay | Admitting: Ophthalmology

## 2021-02-01 ENCOUNTER — Ambulatory Visit: Payer: BC Managed Care – PPO | Admitting: Family

## 2021-02-01 NOTE — Progress Notes (Deleted)
Office Visit    Patient Name: Cody Matthews Date of Encounter: 02/01/2021  PCP:  Cletis Athens, [place]  Cardiologist:  Nelva Bush, MD  Advanced Practice Provider:  No care team member to display Electrophysiologist:  None    Chief Complaint    Cody Matthews is a 60 y.o. male with a hx of chest pain (low risk myoview with coronary calcification), COPD, tobacco use, BPH, OSA on CPAP, HLD presents today for follow up of coronary artery calcification.   Past Medical History    Past Medical History:  Diagnosis Date  . Appendicitis   . BPH (benign prostatic hyperplasia)   . Emphysema lung ([place])   . Hernia, incisional   . Hyperlipidemia   . Lung nodules   . Rosacea   . Sprain    rotator cuff  . Wears dentures    full upper and lower   Past Surgical History:  Procedure Laterality Date  . ABDOMINAL HERNIA REPAIR  2009  . APPENDECTOMY    . CARDIAC CATHETERIZATION  approx 2000   no issues  . CATARACT EXTRACTION W/PHACO Right 01/29/2021   Procedure: CATARACT EXTRACTION PHACO AND INTRAOCULAR LENS PLACEMENT (IOC) RIGHT 3.86 00:30.4;  Surgeon: Eulogio Bear, MD;  Location: [facility];  Service: Ophthalmology;  Laterality: Right;  . COLONOSCOPY WITH PROPOFOL N/A 10/19/2015   Procedure: COLONOSCOPY WITH PROPOFOL;  Surgeon: Lucilla Lame, MD;  Location: [facility];  Service: Endoscopy;  Laterality: N/A;  . POLYPECTOMY  10/19/2015   Procedure: POLYPECTOMY;  Surgeon: Lucilla Lame, MD;  Location: [facility];  Service: Endoscopy;;  . SHOULDER SURGERY Left 2014, 2015    Allergies  Allergies  Allergen Reactions  . Codeine Other (See Comments)    HA/migraine  . Penicillins Other (See Comments)    Elevated blood pressure    History of Present Illness    Cody Matthews is a 60 y.o. male with a hx of chest pain (low risk myoview with coronary calcification), COPD, tobacco use, BPH, OSA on CPAP, HLD. He was last seen  09/20/19 by Dr. Saunders Revel.  Previous lexiscan myoview January 2018 was low risk study with small in size, mild in severity, fixed defect at the apex likely due to apical thinning with normal LVEF.   When last seen 09/2019 he was doing well from a cardiac perspective. Reported only occasional sensation of "something not right" in his chest which lasted only seconds and had been stable for years. His chief complaint was hip pain. He was recommended for 1 month statin holiday.   ***  EKGs/Labs/Other Studies Reviewed:   The following studies were reviewed today:  Pharmacologic myocardial perfusion stress test (12/23/16): Low risk study. There is a small in size, mild in severity, fixed defect at the apex, most likely due to apical thinning. LVEF normal. TTE: Reportedly normal per the patient. Stress test (10/28/16): Exercise duration of 8 minutes with peak HR 114 bpm. No significant ST segment changes (conflicting reports of chest pain during the study).  EKG:  EKG is  ordered today.  The ekg ordered today demonstrates ***  Recent Labs: No results found for requested labs within last 8760 hours.  Recent Lipid Panel No results found for: CHOL, TRIG, HDL, CHOLHDL, VLDL, LDLCALC, LDLDIRECT   Home Medications   No outpatient medications have been marked as taking for the 02/01/21 encounter (Appointment) with Loel Dubonnet, NP.     Review of Systems   ***  ROS All other systems reviewed and are otherwise negative except as noted above.  Physical Exam    VS:  There were no vitals taken for this visit. , BMI There is no height or weight on file to calculate BMI.  Wt Readings from Last 3 Encounters:  01/29/21 248 lb (112.5 kg)  11/02/20 242 lb 4.8 oz (109.9 kg)  04/22/20 242 lb (109.8 kg)     GEN: Well nourished, well developed, in no acute distress. HEENT: normal. Neck: Supple, no JVD, carotid bruits, or masses. Cardiac: ***RRR, no murmurs, rubs, or gallops. No clubbing, cyanosis,  edema.  ***Radials/DP/PT 2+ and equal bilaterally.  Respiratory:  ***Respirations regular and unlabored, clear to auscultation bilaterally. GI: Soft, nontender, nondistended. MS: No deformity or atrophy. Skin: Warm and dry, no rash. Neuro:  Strength and sensation are intact. Psych: Normal affect.  Assessment & Plan    1. Atypical chest pain -   2. Coronary artery calcification -   3. HTN -   4. HLD -   Disposition: Follow up {follow up:[id number]} with Dr. Saunders Revel or APP   Signed, Loel Dubonnet, NP 02/01/2021, 7:50 AM [place]

## 2021-02-08 ENCOUNTER — Ambulatory Visit: Payer: BC Managed Care – PPO | Admitting: Physician Assistant

## 2021-02-27 ENCOUNTER — Other Ambulatory Visit: Payer: Self-pay | Admitting: Internal Medicine

## 2021-03-08 ENCOUNTER — Encounter: Payer: Self-pay | Admitting: Ophthalmology

## 2021-03-08 ENCOUNTER — Other Ambulatory Visit: Payer: Self-pay

## 2021-03-12 DIAGNOSIS — H2512 Age-related nuclear cataract, left eye: Secondary | ICD-10-CM | POA: Diagnosis not present

## 2021-03-15 ENCOUNTER — Other Ambulatory Visit
Admission: RE | Admit: 2021-03-15 | Discharge: 2021-03-15 | Disposition: A | Discharge status: Home / Self Care | Payer: BC Managed Care – PPO | Source: Ambulatory Visit | Attending: Ophthalmology | Admitting: Ophthalmology

## 2021-03-15 ENCOUNTER — Other Ambulatory Visit: Payer: Self-pay

## 2021-03-15 DIAGNOSIS — Z20822 Contact with and (suspected) exposure to covid-19: Secondary | ICD-10-CM | POA: Diagnosis not present

## 2021-03-15 DIAGNOSIS — Z01812 Encounter for preprocedural laboratory examination: Secondary | ICD-10-CM | POA: Insufficient documentation

## 2021-03-15 LAB — SARS CORONAVIRUS 2 (TAT 6-24 HRS): SARS Coronavirus 2: NEGATIVE

## 2021-03-15 NOTE — Discharge Instructions (Signed)

## 2021-03-19 ENCOUNTER — Encounter
Admission: RE | Disposition: A | Discharge status: Home / Self Care | Payer: Self-pay | Source: Home / Self Care | Attending: Ophthalmology

## 2021-03-19 ENCOUNTER — Ambulatory Visit: Payer: BC Managed Care – PPO | Admitting: Anesthesiology

## 2021-03-19 ENCOUNTER — Other Ambulatory Visit: Payer: Self-pay

## 2021-03-19 ENCOUNTER — Encounter: Payer: Self-pay | Admitting: Ophthalmology

## 2021-03-19 ENCOUNTER — Ambulatory Visit
Admission: RE | Admit: 2021-03-19 | Discharge: 2021-03-19 | Disposition: A | Discharge status: Home / Self Care | Payer: BC Managed Care – PPO | Attending: Ophthalmology | Admitting: Ophthalmology

## 2021-03-19 DIAGNOSIS — Z823 Family history of stroke: Secondary | ICD-10-CM | POA: Insufficient documentation

## 2021-03-19 DIAGNOSIS — H2512 Age-related nuclear cataract, left eye: Secondary | ICD-10-CM | POA: Diagnosis not present

## 2021-03-19 DIAGNOSIS — Z79899 Other long term (current) drug therapy: Secondary | ICD-10-CM | POA: Diagnosis not present

## 2021-03-19 DIAGNOSIS — I10 Essential (primary) hypertension: Secondary | ICD-10-CM | POA: Diagnosis not present

## 2021-03-19 DIAGNOSIS — H25812 Combined forms of age-related cataract, left eye: Secondary | ICD-10-CM | POA: Diagnosis not present

## 2021-03-19 DIAGNOSIS — Z87891 Personal history of nicotine dependence: Secondary | ICD-10-CM | POA: Insufficient documentation

## 2021-03-19 DIAGNOSIS — Z885 Allergy status to narcotic agent status: Secondary | ICD-10-CM | POA: Diagnosis not present

## 2021-03-19 DIAGNOSIS — Z880 Allergy status to penicillin: Secondary | ICD-10-CM | POA: Insufficient documentation

## 2021-03-19 DIAGNOSIS — Z8249 Family history of ischemic heart disease and other diseases of the circulatory system: Secondary | ICD-10-CM | POA: Diagnosis not present

## 2021-03-19 DIAGNOSIS — Z803 Family history of malignant neoplasm of breast: Secondary | ICD-10-CM | POA: Insufficient documentation

## 2021-03-19 DIAGNOSIS — Z825 Family history of asthma and other chronic lower respiratory diseases: Secondary | ICD-10-CM | POA: Insufficient documentation

## 2021-03-19 HISTORY — PX: CATARACT EXTRACTION W/PHACO: SHX586

## 2021-03-19 SURGERY — PHACOEMULSIFICATION, CATARACT, WITH IOL INSERTION
Anesthesia: Monitor Anesthesia Care | Site: Eye | Laterality: Left

## 2021-03-19 MED ORDER — LACTATED RINGERS IV SOLN: INTRAVENOUS | Status: DC

## 2021-03-19 MED ORDER — LIDOCAINE HCL (PF) 2 % IJ SOLN
INTRAOCULAR | Status: DC | PRN
  Administered 2021-03-19: 1 mL via INTRAOCULAR

## 2021-03-19 MED ORDER — MOXIFLOXACIN HCL 0.5 % OP SOLN
OPHTHALMIC | Status: DC | PRN
  Administered 2021-03-19: 0.2 mL via OPHTHALMIC

## 2021-03-19 MED ORDER — SODIUM HYALURONATE 23 MG/ML IO SOLN
INTRAOCULAR | Status: DC | PRN
  Administered 2021-03-19: 0.6 mL via INTRAOCULAR

## 2021-03-19 MED ORDER — EPINEPHRINE PF 1 MG/ML IJ SOLN
INTRAOCULAR | Status: DC | PRN
  Administered 2021-03-19: 66 mL via OPHTHALMIC

## 2021-03-19 MED ORDER — ARMC OPHTHALMIC DILATING DROPS
1.0000 "application " | OPHTHALMIC | Status: DC | PRN
  Administered 2021-03-19 (×3): 1 via OPHTHALMIC

## 2021-03-19 MED ORDER — FENTANYL CITRATE (PF) 100 MCG/2ML IJ SOLN
INTRAMUSCULAR | Status: DC | PRN
  Administered 2021-03-19: 100 ug via INTRAVENOUS

## 2021-03-19 MED ORDER — TETRACAINE HCL 0.5 % OP SOLN
1.0000 [drp] | OPHTHALMIC | Status: DC | PRN
  Administered 2021-03-19 (×3): 1 [drp] via OPHTHALMIC

## 2021-03-19 MED ORDER — MIDAZOLAM HCL 2 MG/2ML IJ SOLN
INTRAMUSCULAR | Status: DC | PRN
  Administered 2021-03-19: 2 mg via INTRAVENOUS

## 2021-03-19 MED ORDER — SODIUM HYALURONATE 10 MG/ML IO SOLN
INTRAOCULAR | Status: DC | PRN
  Administered 2021-03-19: 0.55 mL via INTRAOCULAR

## 2021-03-19 SURGICAL SUPPLY — 17 items
CANNULA ANT/CHMB 27GA (MISCELLANEOUS) ×4 IMPLANT
DISSECTOR HYDRO NUCLEUS 50X22 (MISCELLANEOUS) ×2 IMPLANT
GLOVE PI ULTRA LF STRL 7.5 (GLOVE) ×1 IMPLANT
GLOVE PI ULTRA NON LATEX 7.5 (GLOVE) ×1
GLOVE SURG SYN 8.5  E (GLOVE) ×1
GLOVE SURG SYN 8.5 E (GLOVE) ×1 IMPLANT
GOWN STRL REUS W/ TWL LRG LVL3 (GOWN DISPOSABLE) ×2 IMPLANT
GOWN STRL REUS W/TWL LRG LVL3 (GOWN DISPOSABLE) ×4
LENS IOL TECNIS EYHANCE 23.0 (Intraocular Lens) ×2 IMPLANT
MARKER SKIN DUAL TIP RULER LAB (MISCELLANEOUS) ×2 IMPLANT
PACK DR. KING ARMS (PACKS) ×2 IMPLANT
PACK EYE AFTER SURG (MISCELLANEOUS) ×2 IMPLANT
PACK OPTHALMIC (MISCELLANEOUS) ×2 IMPLANT
SYR 3ML LL SCALE MARK (SYRINGE) ×2 IMPLANT
SYR TB 1ML LUER SLIP (SYRINGE) ×2 IMPLANT
WATER STERILE IRR 250ML POUR (IV SOLUTION) ×2 IMPLANT
WIPE NON LINTING 3.25X3.25 (MISCELLANEOUS) ×2 IMPLANT

## 2021-03-19 NOTE — Anesthesia Postprocedure Evaluation (Signed)
Anesthesia Post Note  Patient: Cody Matthews  Procedure(s) Performed: CATARACT EXTRACTION PHACO AND INTRAOCULAR LENS PLACEMENT (IOC) LEFT 2.49 00:24.8 (Left Eye)     Patient location during evaluation: PACU Anesthesia Type: MAC Level of consciousness: awake and alert Pain management: pain level controlled Vital Signs Assessment: post-procedure vital signs reviewed and stable Respiratory status: spontaneous breathing Cardiovascular status: blood pressure returned to baseline Postop Assessment: no apparent nausea or vomiting, adequate PO intake and no headache Anesthetic complications: no   No complications documented.  Adele Barthel Darby Fleeman

## 2021-03-19 NOTE — Transfer of Care (Signed)
Immediate Anesthesia Transfer of Care Note  Patient: Cody Matthews  Procedure(s) Performed: CATARACT EXTRACTION PHACO AND INTRAOCULAR LENS PLACEMENT (IOC) LEFT 2.49 00:24.8 (Left Eye)  Patient Location: PACU  Anesthesia Type: MAC  Level of Consciousness: awake, alert  and patient cooperative  Airway and Oxygen Therapy: Patient Spontanous Breathing and Patient connected to supplemental oxygen  Post-op Assessment: Post-op Vital signs reviewed, Patient's Cardiovascular Status Stable, Respiratory Function Stable, Patent Airway and No signs of Nausea or vomiting  Post-op Vital Signs: Reviewed and stable  Complications: No complications documented.

## 2021-03-19 NOTE — H&P (Signed)
[place]   Primary Care Physician:  Cletis Athens, MD Ophthalmologist: Dr. Benay Pillow  Pre-Procedure History & Physical: HPI:  Cody Matthews is a 60 y.o. male here for cataract surgery.   Past Medical History:  Diagnosis Date  . Appendicitis   . BPH (benign prostatic hyperplasia)   . Emphysema lung ([place])   . Hernia, incisional   . Hyperlipidemia   . Lung nodules   . Rosacea   . Sprain    rotator cuff  . Wears dentures    full upper and lower    Past Surgical History:  Procedure Laterality Date  . ABDOMINAL HERNIA REPAIR  2009  . APPENDECTOMY    . CARDIAC CATHETERIZATION  approx 2000   no issues  . CATARACT EXTRACTION W/PHACO Right 01/29/2021   Procedure: CATARACT EXTRACTION PHACO AND INTRAOCULAR LENS PLACEMENT (IOC) RIGHT 3.86 00:30.4;  Surgeon: Eulogio Bear, MD;  Location: [facility];  Service: Ophthalmology;  Laterality: Right;  . COLONOSCOPY WITH PROPOFOL N/A 10/19/2015   Procedure: COLONOSCOPY WITH PROPOFOL;  Surgeon: Lucilla Lame, MD;  Location: [facility];  Service: Endoscopy;  Laterality: N/A;  . POLYPECTOMY  10/19/2015   Procedure: POLYPECTOMY;  Surgeon: Lucilla Lame, MD;  Location: [place];  Service: Endoscopy;;  . SHOULDER SURGERY Left 2014, 2015    Prior to Admission medications   Medication Sig Start Date End Date Taking? Authorizing Provider  amLODipine (NORVASC) 5 MG tablet Take 1 tablet (5 mg total) by mouth daily. 01/18/21  Yes Beckie Salts, FNP  atorvastatin (LIPITOR) 80 MG tablet TAKE ONE TABLET BY MOUTH DAILY AT [facility] 02/27/21  Yes Masoud, Viann Shove, MD  meloxicam (MOBIC) 15 MG tablet Take 1 tablet (15 mg total) by mouth daily as needed for pain. 04/22/20  Yes Cook, Jayce G, DO  meloxicam (MOBIC) 15 MG tablet Take 1 tablet (15 mg total) by mouth daily. 11/02/20  Yes Beckie Salts, FNP  metoprolol succinate (TOPROL-XL) 100 MG 24 hr tablet Take 1 tablet (100 mg total) by mouth daily. 01/18/21  Yes Beckie Salts, FNP   metroNIDAZOLE (METROCREAM) 0.75 % cream Apply 1 application topically 2 (two) times daily.   Yes [provider]  tadalafil (CIALIS) 20 MG tablet TK 1 T PO D PRN 01/18/21  Yes Beckie Salts, FNP  tamsulosin (FLOMAX) 0.4 MG CAPS capsule Take 1 capsule (0.4 mg total) by mouth daily. 01/18/21  Yes Beckie Salts, FNP  aspirin 81 MG tablet Take 81 mg by mouth daily. Patient not taking: Reported on 01/16/2021    [provider]  metroNIDAZOLE (METROGEL) 1 % gel APPLY TO AFFECTED AREA TWICE A DAY 10/02/15   [provider]  nitroGLYCERIN (NITROSTAT) 0.4 MG SL tablet Place 1 tablet (0.4 mg total) under the tongue every 5 (five) minutes as needed for chest pain. 11/13/16 09/20/19  End, Harrell Gave, MD    Allergies as of 12/13/2020 - Review Complete 11/14/2020  Allergen Reaction Noted  . Codeine Other (See Comments) 10/09/2015  . Penicillins Other (See Comments) 10/13/2015    Family History  Problem Relation Age of Onset  . Breast cancer Mother   . Emphysema Father   . Heart disease Father   . Heart attack Maternal Grandfather 62  . Stroke Paternal Grandmother   . Heart disease Paternal Grandfather        Pacemaker  . Kidney cancer Neg Hx   . Kidney disease Neg Hx   . Prostate cancer Neg Hx     Social History  Socioeconomic History  . Marital status: Single    Spouse name: Not on file  . Number of children: Not on file  . Years of education: Not on file  . Highest education level: Not on file  Occupational History  . Not on file  Tobacco Use  . Smoking status: Former Smoker    Packs/day: 1.00    Years: 38.00    Pack years: 38.00    Types: Cigarettes    Quit date: 09/05/2018    Years since quitting: 2.5  . Smokeless tobacco: Never Used  Vaping Use  . Vaping Use: Never used  Substance and Sexual Activity  . Alcohol use: Yes    Alcohol/week: 0.0 standard drinks    Comment: Drinks 1 beer every few weeks to month  . Drug use: No  . Sexual activity: Not  on file  Other Topics Concern  . Not on file  Social History Narrative  . Not on file   Social Determinants of Health   Financial Resource Strain: Not on file  Food Insecurity: Not on file  Transportation Needs: Not on file  Physical Activity: Not on file  Stress: Not on file  Social Connections: Not on file  Intimate Partner Violence: Not on file    Review of Systems: See HPI, otherwise negative ROS  Physical Exam: BP 134/71   Pulse (!) 58   Temp 97.8 F (36.6 C) (Temporal)   Ht 5\' 11"  (1.803 m)   Wt 111.1 kg   SpO2 98%   BMI 34.17 kg/m  General:   Alert,  pleasant and cooperative in NAD Head:  Normocephalic and atraumatic. Respiratory:  Normal work of breathing. Cardiovascular:  RRR  Impression/Plan: Cody Matthews is here for cataract surgery.  Risks, benefits, limitations, and alternatives regarding cataract surgery have been reviewed with the patient.  Questions have been answered.  All parties agreeable.   Benay Pillow, MD  03/19/2021, 11:23 AM

## 2021-03-19 NOTE — Anesthesia Procedure Notes (Signed)
Procedure Name: MAC Date/Time: 03/19/2021 11:31 AM Performed by: Silvana Newness, CRNA Pre-anesthesia Checklist: Patient identified, Emergency Drugs available, Suction available, Patient being monitored and Timeout performed Patient Re-evaluated:Patient Re-evaluated prior to induction Oxygen Delivery Method: Nasal cannula Placement Confirmation: positive ETCO2

## 2021-03-19 NOTE — Op Note (Signed)
OPERATIVE NOTE  Cody Matthews [id number] 03/19/2021   PREOPERATIVE DIAGNOSIS:  Nuclear sclerotic cataract left eye.  H25.12   POSTOPERATIVE DIAGNOSIS:    Nuclear sclerotic cataract left eye.     PROCEDURE:  Phacoemusification with posterior chamber intraocular lens placement of the left eye   LENS:   Implant Name Type Inv. Item Serial No. Manufacturer Lot No. LRB No. Used Action  LENS IOL TECNIS EYHANCE 23.0 - [id number] Intraocular Lens LENS IOL TECNIS EYHANCE 23.0 [id number] JOHNSON   Left 1 Implanted      Procedure(s): CATARACT EXTRACTION PHACO AND INTRAOCULAR LENS PLACEMENT (IOC) LEFT 2.49 00:24.8 (Left)  DIB00 +23.0   ULTRASOUND TIME: 0 minutes 24 seconds.  CDE 2.49   SURGEON:  Benay Pillow, MD, MPH   ANESTHESIA:  Topical with tetracaine drops augmented with 1% preservative-free intracameral lidocaine.  ESTIMATED BLOOD LOSS: <1 mL   COMPLICATIONS:  None.   DESCRIPTION OF PROCEDURE:  The patient was identified in the holding room and transported to the operating room and placed in the supine position under the operating microscope.  The left eye was identified as the operative eye and it was prepped and draped in the usual sterile ophthalmic fashion.   A 1.0 millimeter clear-corneal paracentesis was made at the 5:00 position. 0.5 ml of preservative-free 1% lidocaine with epinephrine was injected into the anterior chamber.  The anterior chamber was filled with Healon 5 viscoelastic.  A 2.4 millimeter keratome was used to make a near-clear corneal incision at the 2:00 position.  A curvilinear capsulorrhexis was made with a cystotome and capsulorrhexis forceps.  Balanced salt solution was used to hydrodissect and hydrodelineate the nucleus.   Phacoemulsification was then used in stop and chop fashion to remove the lens nucleus and epinucleus.  The remaining cortex was then removed using the irrigation and aspiration handpiece. Healon was then placed into the capsular bag to  distend it for lens placement.  A lens was then injected into the capsular bag.  The remaining viscoelastic was aspirated.   Wounds were hydrated with balanced salt solution.  The anterior chamber was inflated to a physiologic pressure with balanced salt solution.  Intracameral vigamox 0.1 mL undiltued was injected into the eye and a drop placed onto the ocular surface.  No wound leaks were noted.  The patient was taken to the recovery room in stable condition without complications of anesthesia or surgery  Benay Pillow 03/19/2021, 11:50 AM

## 2021-03-19 NOTE — Anesthesia Preprocedure Evaluation (Signed)
Anesthesia Evaluation  Patient identified by MRN, date of birth, ID band Patient awake    Reviewed: Allergy & Precautions, NPO status , Patient's Chart, lab work & pertinent test results  Airway Mallampati: II  TM Distance: >3 FB Neck ROM: Full    Dental  (+) Lower Dentures, Upper Dentures   Pulmonary COPD, Current Smoker (smokes ~1 ppd) and Patient abstained from smoking.,    Pulmonary exam normal        Cardiovascular Exercise Tolerance: Good hypertension, Pt. on medications and Pt. on home beta blockers Normal cardiovascular exam     Neuro/Psych negative neurological ROS  negative psych ROS   GI/Hepatic negative GI ROS, Neg liver ROS,   Endo/Other  negative endocrine ROS  Renal/GU negative Renal ROS  negative genitourinary   Musculoskeletal  (+) Arthritis , Osteoarthritis,    Abdominal (+) + obese,   Peds  Hematology negative hematology ROS (+)   Anesthesia Other Findings   Reproductive/Obstetrics                             Anesthesia Physical  Anesthesia Plan  ASA: III  Anesthesia Plan: MAC   Post-op Pain Management:    Induction: Intravenous  PONV Risk Score and Plan: 0 and Midazolam, TIVA and Treatment may vary due to age or medical condition  Airway Management Planned: Natural Airway and Nasal Cannula  Additional Equipment: None  Intra-op Plan:   Post-operative Plan:   Informed Consent: I have reviewed the patients History and Physical, chart, labs and discussed the procedure including the risks, benefits and alternatives for the proposed anesthesia with the patient or authorized representative who has indicated his/her understanding and acceptance.       Plan Discussed with: CRNA  Anesthesia Plan Comments:         Anesthesia Quick Evaluation

## 2021-03-20 ENCOUNTER — Encounter: Payer: Self-pay | Admitting: Ophthalmology

## 2021-03-22 ENCOUNTER — Telehealth: Payer: Self-pay | Admitting: Physician Assistant

## 2021-03-22 ENCOUNTER — Other Ambulatory Visit: Payer: Self-pay

## 2021-03-22 ENCOUNTER — Encounter: Payer: BC Managed Care – PPO | Admitting: Physician Assistant

## 2021-03-22 ENCOUNTER — Encounter: Payer: Self-pay | Admitting: Physician Assistant

## 2021-03-22 NOTE — Progress Notes (Signed)
[place] appointment.

## 2021-03-22 NOTE — Telephone Encounter (Signed)
Spoke with patient to locate for visit.    Patient states after 40 minute wait and seeing other people be pulled back before him he left.   Cancelled visit for today.  Patient will call back when ready to reschedule.

## 2021-03-28 ENCOUNTER — Other Ambulatory Visit: Payer: Self-pay

## 2021-03-28 MED ORDER — METRONIDAZOLE 0.75 % EX CREA: 1.0000 "application " | TOPICAL_CREAM | Freq: Two times a day (BID) | CUTANEOUS | 3 refills | Status: DC

## 2021-04-16 ENCOUNTER — Other Ambulatory Visit: Payer: Self-pay | Admitting: Family Medicine

## 2021-04-25 ENCOUNTER — Other Ambulatory Visit: Payer: Self-pay

## 2021-04-25 MED ORDER — ATORVASTATIN CALCIUM 80 MG PO TABS: ORAL_TABLET | ORAL | 3 refills | Status: DC

## 2021-05-08 ENCOUNTER — Other Ambulatory Visit: Payer: Self-pay

## 2021-05-08 MED ORDER — TADALAFIL 20 MG PO TABS: ORAL_TABLET | ORAL | 3 refills | Status: DC

## 2021-05-28 ENCOUNTER — Other Ambulatory Visit: Payer: Self-pay | Admitting: Internal Medicine

## 2021-05-28 DIAGNOSIS — M25511 Pain in right shoulder: Secondary | ICD-10-CM | POA: Insufficient documentation

## 2021-05-28 DIAGNOSIS — M25611 Stiffness of right shoulder, not elsewhere classified: Secondary | ICD-10-CM | POA: Insufficient documentation

## 2021-07-12 ENCOUNTER — Other Ambulatory Visit: Payer: Self-pay | Admitting: Internal Medicine

## 2021-07-27 ENCOUNTER — Other Ambulatory Visit: Payer: Self-pay | Admitting: Family Medicine

## 2021-08-23 ENCOUNTER — Other Ambulatory Visit: Payer: Self-pay | Admitting: Internal Medicine

## 2021-09-19 ENCOUNTER — Other Ambulatory Visit: Payer: Self-pay | Admitting: *Deleted

## 2021-09-19 MED ORDER — METOPROLOL SUCCINATE ER 100 MG PO TB24: 100.0000 mg | ORAL_TABLET | Freq: Every day | ORAL | 6 refills | Status: DC

## 2021-10-10 ENCOUNTER — Other Ambulatory Visit: Payer: Self-pay

## 2021-10-10 ENCOUNTER — Encounter: Payer: Self-pay | Admitting: Internal Medicine

## 2021-10-10 ENCOUNTER — Ambulatory Visit (INDEPENDENT_AMBULATORY_CARE_PROVIDER_SITE_OTHER): Payer: Self-pay | Admitting: Internal Medicine

## 2021-10-10 VITALS — BP 153/72 | HR 65 | Ht 71.0 in | Wt 260.5 lb

## 2021-10-10 DIAGNOSIS — E782 Mixed hyperlipidemia: Secondary | ICD-10-CM

## 2021-10-10 DIAGNOSIS — Z87891 Personal history of nicotine dependence: Secondary | ICD-10-CM

## 2021-10-10 DIAGNOSIS — I2510 Atherosclerotic heart disease of native coronary artery without angina pectoris: Secondary | ICD-10-CM

## 2021-10-10 DIAGNOSIS — I2584 Coronary atherosclerosis due to calcified coronary lesion: Secondary | ICD-10-CM

## 2021-10-10 DIAGNOSIS — I10 Essential (primary) hypertension: Secondary | ICD-10-CM

## 2021-10-10 DIAGNOSIS — Z720 Tobacco use: Secondary | ICD-10-CM

## 2021-10-10 NOTE — Assessment & Plan Note (Signed)
Patient is making efforts to quit smoking

## 2021-10-10 NOTE — Assessment & Plan Note (Signed)

## 2021-10-10 NOTE — Progress Notes (Signed)
Blood pressure trying to quit smoking marijuana  Established Patient Office Visit  Subjective:  Patient ID: Cody Matthews, male    DOB: September 05, 1961  Age: 60 y.o. MRN: [id number]  CC:  Chief Complaint  Patient presents with   Nicotine Dependence    Patient would like to discuss starting nicotine patches     Nicotine Dependence Symptoms are negative for fatigue. His urge triggers include company of smokers.   Cody Matthews presents for check up  Past Medical History:  Diagnosis Date   Appendicitis    BPH (benign prostatic hyperplasia)    Emphysema lung ([place])    Hernia, incisional    Hyperlipidemia    Lung nodules    Rosacea    Sprain    rotator cuff   Wears dentures    full upper and lower    Past Surgical History:  Procedure Laterality Date   ABDOMINAL HERNIA REPAIR  2009   APPENDECTOMY     CARDIAC CATHETERIZATION  approx 2000   no issues   CATARACT EXTRACTION W/PHACO Right 01/29/2021   Procedure: CATARACT EXTRACTION PHACO AND INTRAOCULAR LENS PLACEMENT (IOC) RIGHT 3.86 00:30.4;  Surgeon: Eulogio Bear, MD;  Location: [place];  Service: Ophthalmology;  Laterality: Right;   CATARACT EXTRACTION W/PHACO Left 03/19/2021   Procedure: CATARACT EXTRACTION PHACO AND INTRAOCULAR LENS PLACEMENT (IOC) LEFT 2.49 00:24.8;  Surgeon: Eulogio Bear, MD;  Location: [place];  Service: Ophthalmology;  Laterality: Left;   COLONOSCOPY WITH PROPOFOL N/A 10/19/2015   Procedure: COLONOSCOPY WITH PROPOFOL;  Surgeon: Lucilla Lame, MD;  Location: [place];  Service: Endoscopy;  Laterality: N/A;   POLYPECTOMY  10/19/2015   Procedure: POLYPECTOMY;  Surgeon: Lucilla Lame, MD;  Location: [place];  Service: Endoscopy;;   SHOULDER SURGERY Left 2014, 2015    Family History  Problem Relation Age of Onset   Breast cancer Mother    Emphysema Father    Heart disease Father    Heart attack Maternal Grandfather 63   Stroke Paternal Grandmother     Heart disease Paternal Grandfather        Pacemaker   Kidney cancer Neg Hx    Kidney disease Neg Hx    Prostate cancer Neg Hx     Social History   Socioeconomic History   Marital status: Single    Spouse name: Not on file   Number of children: Not on file   Years of education: Not on file   Highest education level: Not on file  Occupational History   Not on file  Tobacco Use   Smoking status: Former    Packs/day: 1.00    Years: 38.00    Pack years: 38.00    Types: Cigarettes    Quit date: 09/05/2018    Years since quitting: 3.0   Smokeless tobacco: Never  Vaping Use   Vaping Use: Never used  Substance and Sexual Activity   Alcohol use: Yes    Alcohol/week: 0.0 standard drinks    Comment: Drinks 1 beer every few weeks to month   Drug use: No   Sexual activity: Not on file  Other Topics Concern   Not on file  Social History Narrative   Not on file   Social Determinants of Health   Financial Resource Strain: Not on file  Food Insecurity: Not on file  Transportation Needs: Not on file  Physical Activity: Not on file  Stress: Not on file  Social Connections: Not on file  Intimate Partner Violence: Not on file     Current Outpatient Medications:    amLODipine (NORVASC) 5 MG tablet, TAKE 1 TABLET(5 MG) BY MOUTH DAILY, Disp: 90 tablet, Rfl: 0   aspirin 81 MG tablet, Take 81 mg by mouth daily. (Patient not taking: Reported on 01/16/2021), Disp: , Rfl:    atorvastatin (LIPITOR) 80 MG tablet, TAKE 1 TABLET BY MOUTH DAILY AT 6 PM, Disp: 30 tablet, Rfl: 3   meloxicam (MOBIC) 15 MG tablet, Take 1 tablet (15 mg total) by mouth daily as needed for pain., Disp: 30 tablet, Rfl: 0   meloxicam (MOBIC) 15 MG tablet, Take 1 tablet (15 mg total) by mouth daily., Disp: 30 tablet, Rfl: 0   metoprolol succinate (TOPROL-XL) 100 MG 24 hr tablet, Take 1 tablet (100 mg total) by mouth daily., Disp: 30 tablet, Rfl: 6   metroNIDAZOLE (METROCREAM) 0.75 % cream, Apply 1 application topically 2  (two) times daily., Disp: 45 g, Rfl: 3   nitroGLYCERIN (NITROSTAT) 0.4 MG SL tablet, Place 1 tablet (0.4 mg total) under the tongue every 5 (five) minutes as needed for chest pain., Disp: 30 tablet, Rfl: 5   tadalafil (CIALIS) 20 MG tablet, Take 1 tablet PO Daily PRN, Disp: 30 tablet, Rfl: 3   tamsulosin (FLOMAX) 0.4 MG CAPS capsule, TAKE 1 CAPSULE(0.4 MG) BY MOUTH DAILY, Disp: 30 capsule, Rfl: 5   Allergies  Allergen Reactions   Codeine Other (See Comments)    HA/migraine   Penicillins Other (See Comments)    Elevated blood pressure    ROS Review of Systems  Constitutional: Negative.  Negative for fatigue and fever.  HENT: Negative.    Eyes: Negative.   Respiratory: Negative.  Negative for chest tightness.   Cardiovascular: Negative.   Gastrointestinal: Negative.   Endocrine: Negative.   Genitourinary: Negative.   Musculoskeletal: Negative.   Skin: Negative.   Allergic/Immunologic: Negative.   Neurological: Negative.   Hematological: Negative.   Psychiatric/Behavioral: Negative.    All other systems reviewed and are negative.    Objective:    Physical Exam Vitals reviewed.  Constitutional:      Appearance: Normal appearance. He is obese.  HENT:     Mouth/Throat:     Mouth: Mucous membranes are moist.  Eyes:     Pupils: Pupils are equal, round, and reactive to light.  Neck:     Vascular: No carotid bruit.  Cardiovascular:     Rate and Rhythm: Normal rate and regular rhythm.     Pulses: Normal pulses.     Heart sounds: Normal heart sounds.  Pulmonary:     Effort: Pulmonary effort is normal.     Breath sounds: Normal breath sounds.  Abdominal:     General: Bowel sounds are normal.     Palpations: Abdomen is soft. There is no hepatomegaly, splenomegaly or mass.     Tenderness: There is no abdominal tenderness.     Hernia: No hernia is present.  Musculoskeletal:     Cervical back: Neck supple.     Right lower leg: No edema.     Left lower leg: No edema.   Skin:    Findings: No rash.  Neurological:     Mental Status: He is alert and oriented to person, place, and time.     Motor: No weakness.  Psychiatric:        Mood and Affect: Mood normal.        Behavior: Behavior normal.    BP (!) 153/72  Pulse 65   Ht 5\' 11"  (1.803 m)   Wt 260 lb 8 oz (118.2 kg)   BMI 36.33 kg/m  Wt Readings from Last 3 Encounters:  10/10/21 260 lb 8 oz (118.2 kg)  03/19/21 245 lb (111.1 kg)  01/29/21 248 lb (112.5 kg)     Health Maintenance Due  Topic Date Due   COVID-19 Vaccine (1) Never done   Pneumococcal Vaccine 46-53 Years old (1 - PCV) Never done   HIV Screening  Never done   Hepatitis C Screening  Never done   TETANUS/TDAP  Never done   Zoster Vaccines- Shingrix (1 of 2) Never done   COLONOSCOPY (Pts 45-100yrs Insurance coverage will need to be confirmed)  10/18/2020   INFLUENZA VACCINE  Never done    There are no preventive care reminders to display for this patient.  No results found for: TSH No results found for: WBC, HGB, HCT, MCV, PLT Lab Results  Component Value Date   NA 141 09/25/2017   K 4.1 09/25/2017   CO2 22 09/25/2017   GLUCOSE 72 09/25/2017   BUN 13 09/25/2017   CREATININE 0.88 09/25/2017   CALCIUM 9.4 09/25/2017   No results found for: CHOL No results found for: HDL No results found for: LDLCALC No results found for: TRIG No results found for: CHOLHDL No results found for: HGBA1C    Assessment & Plan:   Problem List Items Addressed This Visit       Cardiovascular and Mediastinum   Coronary artery calcification    Patient was advised to lose weight and continue statin      Essential hypertension - Primary     Patient denies any chest pain or shortness of breath there is no history of palpitation or paroxysmal nocturnal dyspnea   patient was advised to follow low-salt low-cholesterol diet    ideally I want to keep systolic blood pressure below 130 mmHg, patient was asked to check blood pressure one  times a week and give me a report on that.  Patient will be follow-up in 3 months  or earlier as needed, patient will call me back for any change in the cardiovascular symptoms Patient was advised to buy a book from local bookstore concerning blood pressure and read several chapters  every day.  This will be supplemented by some of the material we will give him from the office.  Patient should also utilize other resources like YouTube and Internet to learn more about the blood pressure and the diet.        Other   Personal history of tobacco use, presenting hazards to health    Patient is making efforts to quit smoking      Tobacco abuse    - I instructed the patient to stop smoking and provided them with smoking cessation materials.  - I informed the patient that smoking puts them at increased risk for cancer, COPD, hypertension, and more.  - Informed the patient to seek help if they begin to have trouble breathing, develop chest pain, start to cough up blood, feel faint, or pass out.      Hyperlipidemia    Hypercholesterolemia  I advised the patient to follow Mediterranean diet This diet is rich in fruits vegetables and whole grain, and This diet is also rich in fish and lean meat Patient should also eat a handful of almonds or walnuts daily Recent heart study indicated that average follow-up on this kind of diet reduces the cardiovascular mortality  by 50 to 70%==       No orders of the defined types were placed in this encounter.   Follow-up: No follow-ups on file.    Cletis Athens, MD

## 2021-10-10 NOTE — Assessment & Plan Note (Signed)
Patient was advised to lose weight and continue statin

## 2021-10-10 NOTE — Assessment & Plan Note (Signed)
-   I instructed the patient to stop smoking and provided them with smoking cessation materials.  - I informed the patient that smoking puts them at increased risk for cancer, COPD, hypertension, and more.  - Informed the patient to seek help if they begin to have trouble breathing, develop chest pain, start to cough up blood, feel faint, or pass out.  

## 2021-10-10 NOTE — Assessment & Plan Note (Signed)
Hypercholesterolemia  I advised the patient to follow Mediterranean diet This diet is rich in fruits vegetables and whole grain, and This diet is also rich in fish and lean meat Patient should also eat a handful of almonds or walnuts daily Recent heart study indicated that average follow-up on this kind of diet reduces the cardiovascular mortality by 50 to 70%== 

## 2021-10-11 ENCOUNTER — Other Ambulatory Visit: Payer: Self-pay | Admitting: Internal Medicine

## 2021-11-12 ENCOUNTER — Ambulatory Visit: Payer: Self-pay | Admitting: Internal Medicine

## 2021-12-06 ENCOUNTER — Other Ambulatory Visit: Payer: Self-pay | Admitting: Internal Medicine

## 2021-12-17 ENCOUNTER — Other Ambulatory Visit: Payer: Self-pay | Admitting: Internal Medicine

## 2022-01-10 ENCOUNTER — Other Ambulatory Visit: Payer: Self-pay | Admitting: Internal Medicine

## 2022-02-08 ENCOUNTER — Other Ambulatory Visit: Payer: Self-pay

## 2022-02-17 ENCOUNTER — Other Ambulatory Visit: Payer: Self-pay | Admitting: Internal Medicine

## 2022-03-06 ENCOUNTER — Other Ambulatory Visit: Payer: Self-pay | Admitting: Internal Medicine

## 2022-03-11 ENCOUNTER — Other Ambulatory Visit: Payer: Self-pay

## 2022-04-09 ENCOUNTER — Other Ambulatory Visit: Payer: Self-pay | Admitting: Internal Medicine

## 2022-04-10 ENCOUNTER — Telehealth: Payer: Self-pay | Admitting: Internal Medicine

## 2022-04-10 NOTE — Telephone Encounter (Signed)
4 attempts were made for FU appts, deleting from recall list ?

## 2022-05-05 ENCOUNTER — Other Ambulatory Visit: Payer: Self-pay | Admitting: Internal Medicine

## 2022-06-28 ENCOUNTER — Ambulatory Visit (INDEPENDENT_AMBULATORY_CARE_PROVIDER_SITE_OTHER): Payer: 59

## 2022-06-28 ENCOUNTER — Encounter: Payer: Self-pay | Admitting: Emergency Medicine

## 2022-06-28 ENCOUNTER — Ambulatory Visit
Admission: EM | Admit: 2022-06-28 | Discharge: 2022-06-28 | Disposition: A | Discharge status: Home / Self Care | Payer: 59 | Attending: Emergency Medicine | Admitting: Emergency Medicine

## 2022-06-28 DIAGNOSIS — M778 Other enthesopathies, not elsewhere classified: Secondary | ICD-10-CM | POA: Diagnosis not present

## 2022-06-28 DIAGNOSIS — M25571 Pain in right ankle and joints of right foot: Secondary | ICD-10-CM

## 2022-06-28 DIAGNOSIS — M19071 Primary osteoarthritis, right ankle and foot: Secondary | ICD-10-CM | POA: Diagnosis not present

## 2022-06-28 DIAGNOSIS — S99921A Unspecified injury of right foot, initial encounter: Secondary | ICD-10-CM | POA: Diagnosis not present

## 2022-06-28 DIAGNOSIS — S93401A Sprain of unspecified ligament of right ankle, initial encounter: Secondary | ICD-10-CM

## 2022-06-28 DIAGNOSIS — M7731 Calcaneal spur, right foot: Secondary | ICD-10-CM | POA: Diagnosis not present

## 2022-06-28 DIAGNOSIS — M7989 Other specified soft tissue disorders: Secondary | ICD-10-CM | POA: Diagnosis not present

## 2022-06-28 DIAGNOSIS — M79671 Pain in right foot: Secondary | ICD-10-CM | POA: Diagnosis not present

## 2022-06-28 MED ORDER — MELOXICAM 15 MG PO TABS: 15.0000 mg | ORAL_TABLET | Freq: Every day | ORAL | 0 refills | Status: DC

## 2022-06-28 NOTE — Discharge Instructions (Addendum)
Keep your ankle elevated is much as possible to help decrease swelling and aid in healing.  Apply moist heat to your ankle for 20 minutes at a time to help improve blood flow which will bring fresh oxygen and nutrients to the ligaments and help facilitate the removal of metabolic byproducts from inflammation.  Take over-the-counter Tylenol, 1000 mg, every 6 hours with food to help with inflammation and pain.  Follow the rehabilitation exercises given your discharge instructions.  Wait to start the phase 1 exercises until 48 hours after your injury to give time for the inflammation to go down.  Progress to phase 2 after you can complete phase 1 with out any significant pain.

## 2022-06-28 NOTE — ED Triage Notes (Signed)
Patient states that he tripped over his new puppy x 2 weeks ago, injuring his right ankle/foot.  Patient is having swelling and pain.  Patient has taken Tylenol and Ibuprofen for discomfort, applied ice and heat.

## 2022-06-28 NOTE — ED Provider Notes (Signed)
[facility]    CSN: [id number] Arrival date & time: 06/28/22  1015      History   Chief Complaint Chief Complaint  Patient presents with   Fall    HPI Cody Matthews is a 61 y.o. male.   HPI  61 year old male here for evaluation of right ankle pain.  Patient reports that he tripped over his puppy approximately 2 weeks ago and thinks that he landed hard on his right foot.  Since then he has been experiencing pain on the outside of his right ankle along with swelling.  He states that there is no bruising initially.  He still has not noticed any bruising and he denies numbness or tingling in his toes.  He has full range of motion of his foot and ankle but with pain.  He states that it also hurts to bear weight on the outside of his ankle and down at the bottom of his foot.  Past Medical History:  Diagnosis Date   Appendicitis    BPH (benign prostatic hyperplasia)    Emphysema lung (HCC)    Hernia, incisional    Hyperlipidemia    Lung nodules    Rosacea    Sprain    rotator cuff   Wears dentures    full upper and lower    Patient Active Problem List   Diagnosis Date Noted   Acute idiopathic gout of left wrist 11/02/2020   Pain in both lower extremities 09/20/2019   Hyperlipidemia 03/26/2018   Leg cramps 09/25/2017   Essential hypertension 09/25/2017   Atypical chest pain 03/04/2017   Coronary artery calcification 03/04/2017   Dyslipidemia 03/04/2017   Tobacco abuse 03/04/2017   Personal history of tobacco use, presenting hazards to health 08/12/2016   Special screening for malignant neoplasms, colon    Benign neoplasm of descending colon    Benign neoplasm of sigmoid colon     Past Surgical History:  Procedure Laterality Date   ABDOMINAL HERNIA REPAIR  2009   APPENDECTOMY     CARDIAC CATHETERIZATION  approx 2000   no issues   CATARACT EXTRACTION W/PHACO Right 01/29/2021   Procedure: CATARACT EXTRACTION PHACO AND INTRAOCULAR LENS PLACEMENT (IOC)  RIGHT 3.86 00:30.4;  Surgeon: Eulogio Bear, MD;  Location: [place];  Service: Ophthalmology;  Laterality: Right;   CATARACT EXTRACTION W/PHACO Left 03/19/2021   Procedure: CATARACT EXTRACTION PHACO AND INTRAOCULAR LENS PLACEMENT (IOC) LEFT 2.49 00:24.8;  Surgeon: Eulogio Bear, MD;  Location: [place];  Service: Ophthalmology;  Laterality: Left;   COLONOSCOPY WITH PROPOFOL N/A 10/19/2015   Procedure: COLONOSCOPY WITH PROPOFOL;  Surgeon: Lucilla Lame, MD;  Location: [place];  Service: Endoscopy;  Laterality: N/A;   POLYPECTOMY  10/19/2015   Procedure: POLYPECTOMY;  Surgeon: Lucilla Lame, MD;  Location: [place];  Service: Endoscopy;;   SHOULDER SURGERY Left 2014, 2015       Home Medications    Prior to Admission medications   Medication Sig Start Date End Date Taking? Authorizing Provider  amLODipine (NORVASC) 5 MG tablet TAKE 1 TABLET(5 MG) BY MOUTH DAILY 04/09/22  Yes Masoud, Viann Shove, MD  aspirin 81 MG tablet Take 81 mg by mouth daily.   Yes [provider]  atorvastatin (LIPITOR) 80 MG tablet TAKE 1 TABLET BY MOUTH DAILY AT 6 PM 05/06/22  Yes Masoud, Viann Shove, MD  meloxicam (MOBIC) 15 MG tablet Take 1 tablet (15 mg total) by mouth daily as needed for pain. 04/22/20  Yes  Cook, Jayce G, DO  meloxicam (MOBIC) 15 MG tablet Take 1 tablet (15 mg total) by mouth daily. 11/02/20  Yes Beckie Salts, FNP  metoprolol succinate (TOPROL-XL) 100 MG 24 hr tablet TAKE 1 TABLET(100 MG) BY MOUTH DAILY 03/06/22  Yes Masoud, Viann Shove, MD  metroNIDAZOLE (METROCREAM) 0.75 % cream Apply 1 application topically 2 (two) times daily. 03/28/21  Yes Masoud, Viann Shove, MD  tadalafil (CIALIS) 20 MG tablet Take 1 tablet PO Daily PRN 05/08/21  Yes Masoud, Viann Shove, MD  tamsulosin (FLOMAX) 0.4 MG CAPS capsule TAKE 1 CAPSULE(0.4 MG) BY MOUTH DAILY 02/18/22  Yes Masoud, Viann Shove, MD  nitroGLYCERIN (NITROSTAT) 0.4 MG SL tablet Place 1 tablet (0.4 mg total) under the tongue every 5 (five)  minutes as needed for chest pain. 11/13/16 09/20/19  End, Harrell Gave, MD    Family History Family History  Problem Relation Age of Onset   Breast cancer Mother    Emphysema Father    Heart disease Father    Heart attack Maternal Grandfather 4   Stroke Paternal Grandmother    Heart disease Paternal Grandfather        Pacemaker   Kidney cancer Neg Hx    Kidney disease Neg Hx    Prostate cancer Neg Hx     Social History Social History   Tobacco Use   Smoking status: Former    Packs/day: 1.00    Years: 38.00    Total pack years: 38.00    Types: Cigarettes    Quit date: 09/05/2018    Years since quitting: 3.8   Smokeless tobacco: Never  Vaping Use   Vaping Use: Never used  Substance Use Topics   Alcohol use: Yes    Alcohol/week: 0.0 standard drinks of alcohol    Comment: Drinks 1 beer every few weeks to month   Drug use: No     Allergies   Codeine and Penicillins   Review of Systems Review of Systems  Musculoskeletal:  Positive for arthralgias and joint swelling.  Skin:  Negative for color change.  Neurological:  Negative for weakness and numbness.  Hematological: Negative.   Psychiatric/Behavioral: Negative.       Physical Exam Triage Vital Signs ED Triage Vitals  Enc Vitals Group     BP      Pulse      Resp      Temp      Temp src      SpO2      Weight      Height      Head Circumference      Peak Flow      Pain Score      Pain Loc      Pain Edu?      Excl. in [place]?    No data found.  Updated Vital Signs BP 131/71 (BP Location: Left Arm)   Pulse (!) 54   Temp 97.7 F (36.5 C) (Oral)   Resp 18   Ht '5\' 10"'$  (1.778 m)   Wt 270 lb (122.5 kg)   SpO2 99%   BMI 38.74 kg/m   Visual Acuity Right Eye Distance:   Left Eye Distance:   Bilateral Distance:    Right Eye Near:   Left Eye Near:    Bilateral Near:     Physical Exam Vitals and nursing note reviewed.  Constitutional:      Appearance: Normal appearance. He is not ill-appearing.   HENT:     Head: Normocephalic and atraumatic.  Musculoskeletal:  General: Swelling, tenderness and signs of injury present. No deformity. Normal range of motion.  Skin:    General: Skin is warm and dry.     Capillary Refill: Capillary refill takes less than 2 seconds.     Findings: No bruising or erythema.  Neurological:     General: No focal deficit present.     Mental Status: He is alert and oriented to person, place, and time.     Sensory: No sensory deficit.     Motor: No weakness.  Psychiatric:        Mood and Affect: Mood normal.        Behavior: Behavior normal.        Thought Content: Thought content normal.        Judgment: Judgment normal.      UC Treatments / Results  Labs (all labs ordered are listed, but only abnormal results are displayed) Labs Reviewed - No data to display  EKG   Radiology DG Foot Complete Right  Result Date: 06/28/2022 CLINICAL DATA:  Pain after recent trauma EXAM: RIGHT FOOT COMPLETE - 3+ VIEW COMPARISON:  None Available. FINDINGS: Enthesopathic changes are identified at the Achilles insertion site on the posterior calcaneus. A small plantar calcaneal spur is identified. No fractures. No dislocations. No cause for the patient's pain identified. IMPRESSION: No acute abnormalities.  Enthesopathic changes as above. Electronically Signed   By: Dorise Bullion III M.D.   On: 06/28/2022 11:40   DG Ankle Complete Right  Result Date: 06/28/2022 CLINICAL DATA:  Pain and swelling over lateral malleolus for 2 weeks. EXAM: RIGHT ANKLE - COMPLETE 3+ VIEW COMPARISON:  None Available. FINDINGS: Enthesopathic changes are seen at the post calcaneal insertion site of the Achilles. There is a small calcaneal spur. No acute fractures. Mild degenerative changes in the ankle. The ankle mortise is intact. Lateral soft tissue swelling identified. IMPRESSION: 1. Lateral soft tissue swelling. No fracture. The ankle mortise is intact. Degenerative changes.  Electronically Signed   By: Dorise Bullion III M.D.   On: 06/28/2022 11:38    Procedures Procedures (including critical care time)  Medications Ordered in UC Medications - No data to display  Initial Impression / Assessment and Plan / UC Course  I have reviewed the triage vital signs and the nursing notes.  Pertinent labs & imaging results that were available during my care of the patient were reviewed by me and considered in my medical decision making (see chart for details).  Very pleasant, nontoxic-appearing 61 year old male here for evaluation of radical pain and swelling has been going on for the past 2 weeks as outlined HPI above.  On exam patient's right foot ankle are normal anatomical alignment.  There is swelling over the lateral malleolus and the area is tender to palpation.  Patient also has tenderness with palpation of the distal 6 cm of the fibula.  There is no tenderness with palpation of the medial malleolus, calcaneus, midfoot, metatarsals, or phalanges.  Patient is full range of motion and sensation in his toes and ankle.  There is tenderness with palpation of the base of the fifth metatarsal.  I will obtain radiograph to rule out bony injury given the duration of pain and swelling.  Patient does have a history of fractures in that foot per his report.  Left ankle x-rays independently reviewed and evaluated by me.  Impression: The ankle mortise joint is well-maintained.  There are bone spurs on the lateral aspect of the talus and on  the inferior medial aspect of the fibula.  There is also soft tissue swelling present on the lateral aspect of the ankle.  The lateral view shows a plantar bone spur in the calcaneus.  No other abnormalities identified.  Radiology overread is pending. Radiology impression states lateral soft tissue swelling but no evidence of fracture.  Ankle mortise is intact.  Degenerative changes noted.  Left foot x-rays independent reviewed and evaluated by me.   Impression: No evidence of fracture or dislocation.  There is a mild osteophyte on the lateral aspect of the base of the fifth metatarsal.  Also some soft tissue swelling present.  There is an opacity visible on the lateral view inferior to the cuboid of undetermined age.  The contours are smooth.  Radiology over read is pending. Radiology impression states no acute abnormalities.  There is enthesopathic changes of the Achilles into the posterior calcaneus identified.  Given the lack of radiographic evidence of fracture I will treat the patient for a right ankle sprain.  I will have him continue with elevation, ice or moist heat, over-the-counter Tylenol that he can take in conjunction with his meloxicam, and elevation.  Final Clinical Impressions(s) / UC Diagnoses   Final diagnoses:  Sprain of right ankle, unspecified ligament, initial encounter     Discharge Instructions      Keep your ankle elevated is much as possible to help decrease swelling and aid in healing.  Apply moist heat to your ankle for 20 minutes at a time to help improve blood flow which will bring fresh oxygen and nutrients to the ligaments and help facilitate the removal of metabolic byproducts from inflammation.  Take over-the-counter Tylenol, 1000 mg, every 6 hours with food to help with inflammation and pain.  Follow the rehabilitation exercises given your discharge instructions.  Wait to start the phase 1 exercises until 48 hours after your injury to give time for the inflammation to go down.  Progress to phase 2 after you can complete phase 1 with out any significant pain.      ED Prescriptions   None    PDMP not reviewed this encounter.   Margarette Canada, NP 06/28/22 1150

## 2022-08-05 ENCOUNTER — Encounter: Payer: Self-pay | Admitting: Internal Medicine

## 2022-08-05 ENCOUNTER — Ambulatory Visit (INDEPENDENT_AMBULATORY_CARE_PROVIDER_SITE_OTHER): Payer: 59 | Admitting: Internal Medicine

## 2022-08-05 VITALS — BP 128/70 | HR 58 | Ht 70.0 in | Wt 270.6 lb

## 2022-08-05 DIAGNOSIS — Z1211 Encounter for screening for malignant neoplasm of colon: Secondary | ICD-10-CM | POA: Diagnosis not present

## 2022-08-05 DIAGNOSIS — I2584 Coronary atherosclerosis due to calcified coronary lesion: Secondary | ICD-10-CM

## 2022-08-05 DIAGNOSIS — E66812 Other obesity due to excess calories: Secondary | ICD-10-CM

## 2022-08-05 DIAGNOSIS — I10 Essential (primary) hypertension: Secondary | ICD-10-CM | POA: Diagnosis not present

## 2022-08-05 DIAGNOSIS — Z0000 Encounter for general adult medical examination without abnormal findings: Secondary | ICD-10-CM | POA: Diagnosis not present

## 2022-08-05 DIAGNOSIS — E785 Hyperlipidemia, unspecified: Secondary | ICD-10-CM | POA: Diagnosis not present

## 2022-08-05 DIAGNOSIS — I2510 Atherosclerotic heart disease of native coronary artery without angina pectoris: Secondary | ICD-10-CM

## 2022-08-05 DIAGNOSIS — Z720 Tobacco use: Secondary | ICD-10-CM | POA: Diagnosis not present

## 2022-08-05 DIAGNOSIS — E6609 Other obesity due to excess calories: Secondary | ICD-10-CM

## 2022-08-05 MED ORDER — METOPROLOL SUCCINATE ER 100 MG PO TB24: ORAL_TABLET | ORAL | 3 refills | Status: DC

## 2022-08-05 MED ORDER — METRONIDAZOLE 0.75 % EX CREA: 1.0000 | TOPICAL_CREAM | Freq: Two times a day (BID) | CUTANEOUS | 3 refills | Status: DC

## 2022-08-05 MED ORDER — ATORVASTATIN CALCIUM 80 MG PO TABS: ORAL_TABLET | ORAL | 3 refills | Status: DC

## 2022-08-05 MED ORDER — TADALAFIL 20 MG PO TABS: ORAL_TABLET | ORAL | 6 refills | Status: DC

## 2022-08-05 MED ORDER — AMLODIPINE BESYLATE 5 MG PO TABS: ORAL_TABLET | ORAL | 3 refills | Status: DC

## 2022-08-05 MED ORDER — TAMSULOSIN HCL 0.4 MG PO CAPS: 0.8000 mg | ORAL_CAPSULE | Freq: Every day | ORAL | 3 refills | Status: DC

## 2022-08-05 NOTE — Assessment & Plan Note (Signed)
stable °

## 2022-08-05 NOTE — Addendum Note (Signed)
Addended by: Alois Cliche on: 08/05/2022 02:33 PM   Modules accepted: Orders

## 2022-08-05 NOTE — Assessment & Plan Note (Signed)

## 2022-08-05 NOTE — Assessment & Plan Note (Signed)
Patient denies any chest pain or shortness of breath heart is regular chest is clear abdomen is soft nontender without any hepatosplenomegaly.  Prostate examination was done also was found to be normal and his sphincter is normal.  There is no pedal edema no calf tenderness.

## 2022-08-05 NOTE — Assessment & Plan Note (Addendum)
Patient has stopped smoking 

## 2022-08-05 NOTE — Progress Notes (Signed)
Yearly.        Status: Full     Review  Right shoulder    Patient and recently history- I instructed the patient to stop smoking and provided them with smoking cessation materials.  - I informed the patient that smoking puts them at increased risk for cancer, COPD, hypertension, and more.  - Informed the patient to seek help if they begin to have trouble breathing, develop chest pain, start to cough up blood, feel faint, or pass out.  [name].  Structure that is certain has an enabler back in May she has not  Review of the mesh  Normal right shoulder Minimal left shoulder was reviewed    We will check appropriate  We will check appropriate check ears check next   Gait  Okay Patient is  Any  MDM  History of bladder area   Continue the same he has need for for photophobia okay is is not ready  I have no problem with this morning  MD 6.  Patient  Date Cody Matthews B5   NA  Pain just no milligrams weight  3.  1    Established Patient Office Visit  Subjective:  Patient ID: Cody Matthews, male    DOB: 1961-12-07  Age: 61 y.o. MRN: [id number]  CC:  Chief Complaint  Patient presents with   Annual Exam    HPI  Cody Matthews presents for physical  Past Medical History:  Diagnosis Date   Appendicitis    BPH (benign prostatic hyperplasia)    Emphysema lung ([place])    Hernia, incisional    Hyperlipidemia    Lung nodules    Rosacea    Sprain    rotator cuff   Wears dentures    full upper and lower    Past Surgical History:  Procedure Laterality Date   ABDOMINAL HERNIA REPAIR  2009   APPENDECTOMY     CARDIAC CATHETERIZATION  approx 2000   no issues   CATARACT EXTRACTION W/PHACO Right 01/29/2021   Procedure: CATARACT EXTRACTION PHACO AND INTRAOCULAR LENS PLACEMENT (IOC) RIGHT 3.86 00:30.4;  Surgeon: Eulogio Bear, MD;  Location: [place];  Service: Ophthalmology;  Laterality: Right;   CATARACT EXTRACTION W/PHACO  Left 03/19/2021   Procedure: CATARACT EXTRACTION PHACO AND INTRAOCULAR LENS PLACEMENT (IOC) LEFT 2.49 00:24.8;  Surgeon: Eulogio Bear, MD;  Location: [place];  Service: Ophthalmology;  Laterality: Left;   COLONOSCOPY WITH PROPOFOL N/A 10/19/2015   Procedure: COLONOSCOPY WITH PROPOFOL;  Surgeon: Lucilla Lame, MD;  Location: [place];  Service: Endoscopy;  Laterality: N/A;   POLYPECTOMY  10/19/2015   Procedure: POLYPECTOMY;  Surgeon: Lucilla Lame, MD;  Location: [place];  Service: Endoscopy;;   SHOULDER SURGERY Left 2014, 2015    Family History  Problem Relation Age of Onset   Breast cancer Mother    Emphysema Father    Heart disease Father    Heart attack Maternal Grandfather 73   Stroke Paternal Grandmother    Heart disease Paternal Grandfather        Pacemaker   Kidney cancer Neg Hx    Kidney disease Neg Hx    Prostate cancer Neg Hx     Social History   Socioeconomic History   Marital status: Single    Spouse name: Not on file   Number of children: Not on file   Years of education: Not on file   Highest education level: Not on file  Occupational History  Not on file  Tobacco Use   Smoking status: Former    Packs/day: 1.00    Years: 38.00    Total pack years: 38.00    Types: Cigarettes    Quit date: 09/05/2018    Years since quitting: 3.9   Smokeless tobacco: Never  Vaping Use   Vaping Use: Never used  Substance and Sexual Activity   Alcohol use: Yes    Alcohol/week: 0.0 standard drinks of alcohol    Comment: Drinks 1 beer every few weeks to month   Drug use: No   Sexual activity: Not on file  Other Topics Concern   Not on file  Social History Narrative   Not on file   Social Determinants of Health   Financial Resource Strain: Not on file  Food Insecurity: Not on file  Transportation Needs: Not on file  Physical Activity: Not on file  Stress: Not on file  Social Connections: Not on file  Intimate Partner Violence: Not  on file     Current Outpatient Medications:    aspirin 81 MG tablet, Take 81 mg by mouth daily., Disp: , Rfl:    meloxicam (MOBIC) 15 MG tablet, Take 1 tablet (15 mg total) by mouth daily., Disp: 30 tablet, Rfl: 0   amLODipine (NORVASC) 5 MG tablet, TAKE 1 TABLET(5 MG) BY MOUTH DAILY, Disp: 90 tablet, Rfl: 3   atorvastatin (LIPITOR) 80 MG tablet, daily, Disp: 90 tablet, Rfl: 3   metoprolol succinate (TOPROL-XL) 100 MG 24 hr tablet, TAKE 1 TABLET(100 MG) BY MOUTH DAILY, Disp: 90 tablet, Rfl: 3   metroNIDAZOLE (METROCREAM) 0.75 % cream, Apply 1 Application topically 2 (two) times daily., Disp: 45 g, Rfl: 3   nitroGLYCERIN (NITROSTAT) 0.4 MG SL tablet, Place 1 tablet (0.4 mg total) under the tongue every 5 (five) minutes as needed for chest pain., Disp: 30 tablet, Rfl: 5   tadalafil (CIALIS) 20 MG tablet, Take 1 tablet PO Daily PRN, Disp: 20 tablet, Rfl: 6   Allergies  Allergen Reactions   Codeine Other (See Comments)    HA/migraine   Penicillins Other (See Comments)    Elevated blood pressure    ROS Review of Systems    Objective:    Physical Exam  BP 128/70   Pulse (!) 58   Ht '5\' 10"'$  (1.778 m)   Wt 270 lb 9.6 oz (122.7 kg)   BMI 38.83 kg/m  Wt Readings from Last 3 Encounters:  08/05/22 270 lb 9.6 oz (122.7 kg)  06/28/22 270 lb (122.5 kg)  10/10/21 260 lb 8 oz (118.2 kg)     Health Maintenance Due  Topic Date Due   COVID-19 Vaccine (1) Never done   HIV Screening  Never done   Hepatitis C Screening  Never done   TETANUS/TDAP  Never done   Zoster Vaccines- Shingrix (1 of 2) Never done   INFLUENZA VACCINE  07/16/2022    There are no preventive care reminders to display for this patient.  No results found for: "TSH" No results found for: "WBC", "HGB", "HCT", "MCV", "PLT" Lab Results  Component Value Date   NA 141 09/25/2017   K 4.1 09/25/2017   CO2 22 09/25/2017   GLUCOSE 72 09/25/2017   BUN 13 09/25/2017   CREATININE 0.88 09/25/2017   CALCIUM 9.4 09/25/2017    No results found for: "CHOL" No results found for: "HDL" No results found for: "LDLCALC" No results found for: "TRIG" No results found for: "CHOLHDL" No results found for: "HGBA1C"  Assessment & Plan:   Problem List Items Addressed This Visit       Cardiovascular and Mediastinum   Coronary artery calcification    stable      Relevant Medications   amLODipine (NORVASC) 5 MG tablet   atorvastatin (LIPITOR) 80 MG tablet   metoprolol succinate (TOPROL-XL) 100 MG 24 hr tablet   tadalafil (CIALIS) 20 MG tablet   Essential hypertension    stable      Relevant Medications   amLODipine (NORVASC) 5 MG tablet   atorvastatin (LIPITOR) 80 MG tablet   metoprolol succinate (TOPROL-XL) 100 MG 24 hr tablet   tadalafil (CIALIS) 20 MG tablet     Other   Dyslipidemia    Hyperlipidemia Hypercholesterolemia  I advised the patient to follow Mediterranean diet This diet is rich in fruits vegetables and whole grain, and This diet is also rich in fish and lean meat Patient should also eat a handful of almonds or walnuts daily Recent heart study indicated that average follow-up on this kind of diet reduces the cardiovascular mortality by 50 to 70%==      Relevant Medications   atorvastatin (LIPITOR) 80 MG tablet   Tobacco abuse    Patient has stopped smoking      Screening for colon cancer   Relevant Orders   Cologuard   Annual physical exam - Primary    Patient denies any chest pain or shortness of breath heart is regular chest is clear abdomen is soft nontender without any hepatosplenomegaly.  Prostate examination was done also was found to be normal and his sphincter is normal.  There is no pedal edema no calf tenderness.      Relevant Orders   CBC with Differential/Platelet   COMPLETE METABOLIC PANEL WITH GFR   Lipid panel   TSH   PSA   Class 2 obesity due to excess calories without serious comorbidity in adult    - I encouraged the patient to lose weight.  - I  educated them on making healthy dietary choices including eating more fruits and vegetables and less fried foods. - I encouraged the patient to exercise more, and educated on the benefits of exercise including weight loss, diabetes prevention, and hypertension prevention.   Dietary counseling with a registered dietician  Referral to a weight management support group (e.g. Weight Watchers, Overeaters Anonymous)  If your BMI is greater than 29 or you have gained more than 15 pounds you should work on weight loss.  Attend a healthy cooking class        Meds ordered this encounter  Medications   metroNIDAZOLE (METROCREAM) 0.75 % cream    Sig: Apply 1 Application topically 2 (two) times daily.    Dispense:  45 g    Refill:  3   amLODipine (NORVASC) 5 MG tablet    Sig: TAKE 1 TABLET(5 MG) BY MOUTH DAILY    Dispense:  90 tablet    Refill:  3   atorvastatin (LIPITOR) 80 MG tablet    Sig: daily    Dispense:  90 tablet    Refill:  3   metoprolol succinate (TOPROL-XL) 100 MG 24 hr tablet    Sig: TAKE 1 TABLET(100 MG) BY MOUTH DAILY    Dispense:  90 tablet    Refill:  3   tadalafil (CIALIS) 20 MG tablet    Sig: Take 1 tablet PO Daily PRN    Dispense:  20 tablet    Refill:  6  Follow-up: No follow-ups on file.    Rayman Petrosian, MD 

## 2022-08-05 NOTE — Assessment & Plan Note (Signed)
Hyperlipidemia Hypercholesterolemia  I advised the patient to follow Mediterranean diet This diet is rich in fruits vegetables and whole grain, and This diet is also rich in fish and lean meat Patient should also eat a handful of almonds or walnuts daily Recent heart study indicated that average follow-up on this kind of diet reduces the cardiovascular mortality by 50 to 70%==

## 2022-08-06 LAB — COMPLETE METABOLIC PANEL WITH GFR
AG Ratio: 1.5 (calc) (ref 1.0–2.5)
ALT: 25 U/L (ref 9–46)
AST: 19 U/L (ref 10–35)
Albumin: 4.3 g/dL (ref 3.6–5.1)
Alkaline phosphatase (APISO): 68 U/L (ref 35–144)
BUN: 12 mg/dL (ref 7–25)
CO2: 23 mmol/L (ref 20–32)
Calcium: 9.7 mg/dL (ref 8.6–10.3)
Chloride: 102 mmol/L (ref 98–110)
Creat: 0.94 mg/dL (ref 0.70–1.35)
Globulin: 2.8 g/dL (calc) (ref 1.9–3.7)
Glucose, Bld: 114 mg/dL — ABNORMAL HIGH (ref 65–99)
Potassium: 4 mmol/L (ref 3.5–5.3)
Sodium: 137 mmol/L (ref 135–146)
Total Bilirubin: 0.5 mg/dL (ref 0.2–1.2)
Total Protein: 7.1 g/dL (ref 6.1–8.1)
eGFR: 92 mL/min/{1.73_m2} (ref >=60)

## 2022-08-06 LAB — LIPID PANEL
Cholesterol: 84 mg/dL (ref <200)
HDL: 27 mg/dL — ABNORMAL LOW (ref >=40)
LDL Cholesterol (Calc): 37 mg/dL (calc)
Non-HDL Cholesterol (Calc): 57 mg/dL (calc) (ref <130)
Total CHOL/HDL Ratio: 3.1 (calc) (ref <5.0)
Triglycerides: 122 mg/dL (ref <150)

## 2022-08-06 LAB — CBC WITH DIFFERENTIAL/PLATELET
Absolute Monocytes: 623 cells/uL (ref 200–950)
Basophils Absolute: 83 cells/uL (ref 0–200)
Basophils Relative: 1.1 %
Eosinophils Absolute: 383 cells/uL (ref 15–500)
Eosinophils Relative: 5.1 %
HCT: 44.2 % (ref 38.5–50.0)
Hemoglobin: 15 g/dL (ref 13.2–17.1)
Lymphs Abs: 1980 cells/uL (ref 850–3900)
MCH: 31.9 pg (ref 27.0–33.0)
MCHC: 33.9 g/dL (ref 32.0–36.0)
MCV: 94 fL (ref 80.0–100.0)
MPV: 10.4 fL (ref 7.5–12.5)
Monocytes Relative: 8.3 %
Neutro Abs: 4433 cells/uL (ref 1500–7800)
Neutrophils Relative %: 59.1 %
Platelets: 228 10*3/uL (ref 140–400)
RBC: 4.7 10*6/uL (ref 4.20–5.80)
RDW: 12.1 % (ref 11.0–15.0)
Total Lymphocyte: 26.4 %
WBC: 7.5 10*3/uL (ref 3.8–10.8)

## 2022-08-06 LAB — PSA: PSA: 3.43 ng/mL (ref <=4.00)

## 2022-08-06 LAB — TSH: TSH: 2.8 mIU/L (ref 0.40–4.50)

## 2022-08-11 DIAGNOSIS — Z1211 Encounter for screening for malignant neoplasm of colon: Secondary | ICD-10-CM | POA: Diagnosis not present

## 2022-08-18 LAB — COLOGUARD
COLOGUARD: NEGATIVE
Cologuard: NEGATIVE

## 2022-08-20 ENCOUNTER — Encounter: Payer: Self-pay | Admitting: Internal Medicine

## 2022-08-20 ENCOUNTER — Ambulatory Visit (INDEPENDENT_AMBULATORY_CARE_PROVIDER_SITE_OTHER): Payer: 59 | Admitting: Internal Medicine

## 2022-08-20 VITALS — BP 124/67 | HR 57 | Ht 70.0 in | Wt 269.3 lb

## 2022-08-20 DIAGNOSIS — E785 Hyperlipidemia, unspecified: Secondary | ICD-10-CM

## 2022-08-20 DIAGNOSIS — E6609 Other obesity due to excess calories: Secondary | ICD-10-CM

## 2022-08-20 DIAGNOSIS — I10 Essential (primary) hypertension: Secondary | ICD-10-CM

## 2022-08-20 DIAGNOSIS — Z720 Tobacco use: Secondary | ICD-10-CM | POA: Diagnosis not present

## 2022-08-20 DIAGNOSIS — I2510 Atherosclerotic heart disease of native coronary artery without angina pectoris: Secondary | ICD-10-CM

## 2022-08-20 DIAGNOSIS — I2584 Coronary atherosclerosis due to calcified coronary lesion: Secondary | ICD-10-CM | POA: Diagnosis not present

## 2022-08-20 NOTE — Assessment & Plan Note (Signed)
Hypercholesterolemia  I advised the patient to follow Mediterranean diet This diet is rich in fruits vegetables and whole grain, and This diet is also rich in fish and lean meat Patient should also eat a handful of almonds or walnuts daily Recent heart study indicated that average follow-up on this kind of diet reduces the cardiovascular mortality by 50 to 70%== 

## 2022-08-20 NOTE — Assessment & Plan Note (Signed)

## 2022-08-20 NOTE — Assessment & Plan Note (Signed)
Patient has quit smoking ?

## 2022-08-20 NOTE — Progress Notes (Signed)
Established Patient Office Visit  Subjective:  Patient ID: Cody Matthews, male    DOB: 1961-11-13  Age: 61 y.o. MRN: [id number]  CC:  Chief Complaint  Patient presents with   Follow-up    Lab results     HPI  [name] presents for check up Past Medical History:  Diagnosis Date   Appendicitis    BPH (benign prostatic hyperplasia)    Emphysema lung ([place])    Hernia, incisional    Hyperlipidemia    Lung nodules    Rosacea    Sprain    rotator cuff   Wears dentures    full upper and lower    Past Surgical History:  Procedure Laterality Date   ABDOMINAL HERNIA REPAIR  2009   APPENDECTOMY     CARDIAC CATHETERIZATION  approx 2000   no issues   CATARACT EXTRACTION W/PHACO Right 01/29/2021   Procedure: CATARACT EXTRACTION PHACO AND INTRAOCULAR LENS PLACEMENT ([place]) RIGHT 3.86 00:30.4;  Surgeon: Eulogio Bear, MD;  Location: [place];  Service: Ophthalmology;  Laterality: Right;   CATARACT EXTRACTION W/PHACO Left 03/19/2021   Procedure: CATARACT EXTRACTION PHACO AND INTRAOCULAR LENS PLACEMENT ([facility]) LEFT 2.49 00:24.8;  Surgeon: Eulogio Bear, MD;  Location: [place];  Service: Ophthalmology;  Laterality: Left;   COLONOSCOPY WITH PROPOFOL N/A 10/19/2015   Procedure: COLONOSCOPY WITH PROPOFOL;  Surgeon: Lucilla Lame, MD;  Location: [place];  Service: Endoscopy;  Laterality: N/A;   POLYPECTOMY  10/19/2015   Procedure: POLYPECTOMY;  Surgeon: Lucilla Lame, MD;  Location: [place];  Service: Endoscopy;;   SHOULDER SURGERY Left 2014, 2015    Family History  Problem Relation Age of Onset   Breast cancer Mother    Emphysema Father    Heart disease Father    Heart attack Maternal Grandfather 31   Stroke Paternal Grandmother    Heart disease Paternal Grandfather        Pacemaker   Kidney cancer Neg Hx    Kidney disease Neg Hx    Prostate cancer Neg Hx     Social History   Socioeconomic History   Marital status: Single     Spouse name: Not on file   Number of children: Not on file   Years of education: Not on file   Highest education level: Not on file  Occupational History   Not on file  Tobacco Use   Smoking status: Former    Packs/day: 1.00    Years: 38.00    Total pack years: 38.00    Types: Cigarettes    Quit date: 09/05/2018    Years since quitting: 3.9   Smokeless tobacco: Never  Vaping Use   Vaping Use: Never used  Substance and Sexual Activity   Alcohol use: Yes    Alcohol/week: 0.0 standard drinks of alcohol    Comment: Drinks 1 beer every few weeks to month   Drug use: No   Sexual activity: Not on file  Other Topics Concern   Not on file  Social History Narrative   Not on file   Social Determinants of Health   Financial Resource Strain: Not on file  Food Insecurity: Not on file  Transportation Needs: Not on file  Physical Activity: Not on file  Stress: Not on file  Social Connections: Not on file  Intimate Partner Violence: Not on file     Current Outpatient Medications:    amLODipine (NORVASC) 5 MG tablet, TAKE 1 TABLET(5 MG)  BY MOUTH DAILY, Disp: 90 tablet, Rfl: 3   aspirin 81 MG tablet, Take 81 mg by mouth daily., Disp: , Rfl:    atorvastatin (LIPITOR) 80 MG tablet, daily, Disp: 90 tablet, Rfl: 3   meloxicam (MOBIC) 15 MG tablet, Take 1 tablet (15 mg total) by mouth daily., Disp: 30 tablet, Rfl: 0   metoprolol succinate (TOPROL-XL) 100 MG 24 hr tablet, TAKE 1 TABLET(100 MG) BY MOUTH DAILY, Disp: 90 tablet, Rfl: 3   metroNIDAZOLE (METROCREAM) 0.75 % cream, Apply 1 Application topically 2 (two) times daily., Disp: 45 g, Rfl: 3   tadalafil (CIALIS) 20 MG tablet, Take 1 tablet PO Daily PRN, Disp: 20 tablet, Rfl: 6   tamsulosin (FLOMAX) 0.4 MG CAPS capsule, Take 2 capsules (0.8 mg total) by mouth daily., Disp: 180 capsule, Rfl: 3   nitroGLYCERIN (NITROSTAT) 0.4 MG SL tablet, Place 1 tablet (0.4 mg total) under the tongue every 5 (five) minutes as needed for chest pain.,  Disp: 30 tablet, Rfl: 5   Allergies  Allergen Reactions   Codeine Other (See Comments)    HA/migraine   Penicillins Other (See Comments)    Elevated blood pressure    ROS Review of Systems  Constitutional: Negative.   HENT: Negative.    Eyes: Negative.   Respiratory: Negative.    Cardiovascular: Negative.   Gastrointestinal: Negative.   Endocrine: Negative.   Genitourinary: Negative.   Musculoskeletal: Negative.   Skin: Negative.   Allergic/Immunologic: Negative.   Neurological: Negative.   Hematological: Negative.   Psychiatric/Behavioral: Negative.    All other systems reviewed and are negative.     Objective:    Physical Exam Vitals reviewed.  Constitutional:      Appearance: Normal appearance. He is obese. He is not ill-appearing or diaphoretic.  HENT:     Mouth/Throat:     Mouth: Mucous membranes are moist.  Eyes:     Pupils: Pupils are equal, round, and reactive to light.  Neck:     Vascular: No carotid bruit.  Cardiovascular:     Rate and Rhythm: Normal rate and regular rhythm.     Pulses: Normal pulses.     Heart sounds: Normal heart sounds.  Pulmonary:     Effort: Pulmonary effort is normal.     Breath sounds: Normal breath sounds.  Abdominal:     General: Bowel sounds are normal.     Palpations: Abdomen is soft. There is no hepatomegaly, splenomegaly or mass.     Tenderness: There is no abdominal tenderness.     Hernia: No hernia is present.  Musculoskeletal:     Cervical back: Neck supple.     Right lower leg: No edema.     Left lower leg: No edema.  Skin:    Findings: No rash.  Neurological:     Mental Status: He is alert and oriented to person, place, and time.     Motor: No weakness.  Psychiatric:        Mood and Affect: Mood normal.        Behavior: Behavior normal.     BP 124/67   Pulse (!) 57   Ht '5\' 10"'  (1.778 m)   Wt 269 lb 4.8 oz (122.2 kg)   BMI 38.64 kg/m  Wt Readings from Last 3 Encounters:  08/20/22 269 lb 4.8 oz  (122.2 kg)  08/05/22 270 lb 9.6 oz (122.7 kg)  06/28/22 270 lb (122.5 kg)     Health Maintenance Due  Topic Date Due  COVID-19 Vaccine (1) Never done   HIV Screening  Never done   Hepatitis C Screening  Never done   TETANUS/TDAP  Never done   Zoster Vaccines- Shingrix (1 of 2) Never done   INFLUENZA VACCINE  07/16/2022    There are no preventive care reminders to display for this patient.  Lab Results  Component Value Date   TSH 2.80 08/05/2022   Lab Results  Component Value Date   WBC 7.5 08/05/2022   HGB 15.0 08/05/2022   HCT 44.2 08/05/2022   MCV 94.0 08/05/2022   PLT 228 08/05/2022   Lab Results  Component Value Date   NA 137 08/05/2022   K 4.0 08/05/2022   CO2 23 08/05/2022   GLUCOSE 114 (H) 08/05/2022   BUN 12 08/05/2022   CREATININE 0.94 08/05/2022   BILITOT 0.5 08/05/2022   AST 19 08/05/2022   ALT 25 08/05/2022   PROT 7.1 08/05/2022   CALCIUM 9.7 08/05/2022   EGFR 92 08/05/2022   Lab Results  Component Value Date   CHOL 84 08/05/2022   Lab Results  Component Value Date   HDL 27 (L) 08/05/2022   Lab Results  Component Value Date   LDLCALC 37 08/05/2022   Lab Results  Component Value Date   TRIG 122 08/05/2022   Lab Results  Component Value Date   CHOLHDL 3.1 08/05/2022   No results found for: "HGBA1C"    Assessment & Plan:   Problem List Items Addressed This Visit       Cardiovascular and Mediastinum   Coronary artery calcification - Primary    Patient denies any chest pain or shortness of breath.  His HDL is low also he was advised to drink wine every day.  He should walk every day and try to lose weight.      Essential hypertension     Patient denies any chest pain or shortness of breath there is no history of palpitation or paroxysmal nocturnal dyspnea   patient was advised to follow low-salt low-cholesterol diet    ideally I want to keep systolic blood pressure below 130 mmHg, patient was asked to check blood pressure one  times a week and give me a report on that.  Patient will be follow-up in 3 months  or earlier as needed, patient will call me back for any change in the cardiovascular symptoms Patient was advised to buy a book from local bookstore concerning blood pressure and read several chapters  every day.  This will be supplemented by some of the material we will give him from the office.  Patient should also utilize other resources like YouTube and Internet to learn more about the blood pressure and the diet.        Other   Dyslipidemia    Hypercholesterolemia  I advised the patient to follow Mediterranean diet This diet is rich in fruits vegetables and whole grain, and This diet is also rich in fish and lean meat Patient should also eat a handful of almonds or walnuts daily Recent heart study indicated that average follow-up on this kind of diet reduces the cardiovascular mortality by 50 to 70%==      Tobacco abuse    Patient has quit smoking.      Class 2 obesity due to excess calories without serious comorbidity in adult    - I encouraged the patient to lose weight.  - I educated them on making healthy dietary choices including eating more fruits and vegetables  and less fried foods. - I encouraged the patient to exercise more, and educated on the benefits of exercise including weight loss, diabetes prevention, and hypertension prevention.   Dietary counseling with a registered dietician  Referral to a weight management support group (e.g. Weight Watchers, Overeaters Anonymous)  If your BMI is greater than 29 or you have gained more than 15 pounds you should work on weight loss.  Attend a healthy cooking class        No orders of the defined types were placed in this encounter.   Follow-up: No follow-ups on file.    Cletis Athens, MD

## 2022-08-20 NOTE — Assessment & Plan Note (Signed)

## 2022-08-20 NOTE — Assessment & Plan Note (Signed)
Patient denies any chest pain or shortness of breath.  His HDL is low also he was advised to drink wine every day.  He should walk every day and try to lose weight.

## 2022-10-16 ENCOUNTER — Ambulatory Visit (INDEPENDENT_AMBULATORY_CARE_PROVIDER_SITE_OTHER): Payer: 59 | Admitting: Internal Medicine

## 2022-10-16 ENCOUNTER — Encounter: Payer: Self-pay | Admitting: Internal Medicine

## 2022-10-16 VITALS — BP 132/76 | HR 84 | Ht 70.0 in | Wt 264.4 lb

## 2022-10-16 DIAGNOSIS — E6609 Other obesity due to excess calories: Secondary | ICD-10-CM

## 2022-10-16 DIAGNOSIS — I10 Essential (primary) hypertension: Secondary | ICD-10-CM | POA: Diagnosis not present

## 2022-10-16 DIAGNOSIS — N390 Urinary tract infection, site not specified: Secondary | ICD-10-CM | POA: Diagnosis not present

## 2022-10-16 DIAGNOSIS — I2584 Coronary atherosclerosis due to calcified coronary lesion: Secondary | ICD-10-CM

## 2022-10-16 DIAGNOSIS — E782 Mixed hyperlipidemia: Secondary | ICD-10-CM | POA: Diagnosis not present

## 2022-10-16 DIAGNOSIS — N3000 Acute cystitis without hematuria: Secondary | ICD-10-CM | POA: Insufficient documentation

## 2022-10-16 DIAGNOSIS — Z720 Tobacco use: Secondary | ICD-10-CM | POA: Diagnosis not present

## 2022-10-16 DIAGNOSIS — I2510 Atherosclerotic heart disease of native coronary artery without angina pectoris: Secondary | ICD-10-CM | POA: Diagnosis not present

## 2022-10-16 LAB — POCT URINALYSIS DIPSTICK
Appearance: NORMAL
Bilirubin, UA: NEGATIVE
Blood, UA: NEGATIVE
Glucose, UA: NEGATIVE
Ketones, UA: 0.5
Nitrite, UA: NEGATIVE
Protein, UA: POSITIVE — AB
Spec Grav, UA: 1.015 (ref 1.010–1.025)
Urobilinogen, UA: 0.2 E.U./dL
pH, UA: 6.5 (ref 5.0–8.0)

## 2022-10-16 MED ORDER — CIPROFLOXACIN HCL 500 MG PO TABS: 500.0000 mg | ORAL_TABLET | Freq: Two times a day (BID) | ORAL | 0 refills | Status: AC

## 2022-10-16 NOTE — Assessment & Plan Note (Signed)
Suggest to lose weight and follow low-cholesterol diet

## 2022-10-16 NOTE — Assessment & Plan Note (Signed)

## 2022-10-16 NOTE — Addendum Note (Signed)
Addended by: Lacretia Nicks L on: 10/16/2022 01:00 PM   Modules accepted: Orders

## 2022-10-16 NOTE — Assessment & Plan Note (Signed)
We will start the patient on Cipro

## 2022-10-16 NOTE — Assessment & Plan Note (Signed)
-   I instructed the patient to stop smoking and provided them with smoking cessation materials.  - I informed the patient that smoking puts them at increased risk for cancer, COPD, hypertension, and more.  - Informed the patient to seek help if they begin to have trouble breathing, develop chest pain, start to cough up blood, feel faint, or pass out.  

## 2022-10-16 NOTE — Assessment & Plan Note (Signed)
Hypercholesterolemia  I advised the patient to follow Mediterranean diet This diet is rich in fruits vegetables and whole grain, and This diet is also rich in fish and lean meat Patient should also eat a handful of almonds or walnuts daily Recent heart study indicated that average follow-up on this kind of diet reduces the cardiovascular mortality by 50 to 70%== 

## 2022-10-16 NOTE — Assessment & Plan Note (Signed)
Ordered left renal disease Procedure Dr. Norva Pavlov has no I can discuss the results- I encouraged the patient to lose weight.  - I educated them on making healthy dietary choices including eating more fruits and vegetables and less fried foods. - I encouraged the patient to exercise more, and educated on the benefits of exercise including weight loss, diabetes prevention, and hypertension prevention.   Dietary counseling with a registered dietician  Referral to a weight management support group (e.g. Weight Watchers, Overeaters Anonymous)  If your BMI is greater than 29 or you have gained more than 15 pounds you should work on weight loss.  Attend a healthy cooking class

## 2022-10-16 NOTE — Progress Notes (Signed)
Established Patient Office Visit  Subjective:  Patient ID: Cody Matthews, male    DOB: 30-Nov-1961  Age: 61 y.o. MRN: [id number]  CC:  Chief Complaint  Patient presents with   Urinary Tract Infection    Patient complains of frequent urination, with pain after voiding. Patient also having some urinary hesitancy.     Urinary Tract Infection  This is a new problem. The current episode started in the past 7 days. The quality of the pain is described as aching. The pain is at a severity of 2/10. There has been no fever. Associated symptoms include frequency. Pertinent negatives include no chills, discharge, flank pain, hematuria, sweats or vomiting. His past medical history is significant for recurrent UTIs.    Cody Matthews presents for uti  Past Medical History:  Diagnosis Date   Appendicitis    BPH (benign prostatic hyperplasia)    Emphysema lung ([place])    Hernia, incisional    Hyperlipidemia    Lung nodules    Rosacea    Sprain    rotator cuff   Wears dentures    full upper and lower    Past Surgical History:  Procedure Laterality Date   ABDOMINAL HERNIA REPAIR  2009   APPENDECTOMY     CARDIAC CATHETERIZATION  approx 2000   no issues   CATARACT EXTRACTION W/PHACO Right 01/29/2021   Procedure: CATARACT EXTRACTION PHACO AND INTRAOCULAR LENS PLACEMENT ([facility]) RIGHT 3.86 00:30.4;  Surgeon: Eulogio Bear, MD;  Location: [facility];  Service: Ophthalmology;  Laterality: Right;   CATARACT EXTRACTION W/PHACO Left 03/19/2021   Procedure: CATARACT EXTRACTION PHACO AND INTRAOCULAR LENS PLACEMENT ([facility]) LEFT 2.49 00:24.8;  Surgeon: Eulogio Bear, MD;  Location: [place];  Service: Ophthalmology;  Laterality: Left;   COLONOSCOPY WITH PROPOFOL N/A 10/19/2015   Procedure: COLONOSCOPY WITH PROPOFOL;  Surgeon: Lucilla Lame, MD;  Location: [place];  Service: Endoscopy;  Laterality: N/A;   POLYPECTOMY  10/19/2015   Procedure: POLYPECTOMY;  Surgeon: Lucilla Lame, MD;  Location: [place];  Service: Endoscopy;;   SHOULDER SURGERY Left 2014, 2015    Family History  Problem Relation Age of Onset   Breast cancer Mother    Emphysema Father    Heart disease Father    Heart attack Maternal Grandfather 48   Stroke Paternal Grandmother    Heart disease Paternal Grandfather        Pacemaker   Kidney cancer Neg Hx    Kidney disease Neg Hx    Prostate cancer Neg Hx     Social History   Socioeconomic History   Marital status: Single    Spouse name: Not on file   Number of children: Not on file   Years of education: Not on file   Highest education level: Not on file  Occupational History   Not on file  Tobacco Use   Smoking status: Former    Packs/day: 1.00    Years: 38.00    Total pack years: 38.00    Types: Cigarettes    Quit date: 09/05/2018    Years since quitting: 4.1   Smokeless tobacco: Never  Vaping Use   Vaping Use: Never used  Substance and Sexual Activity   Alcohol use: Yes    Alcohol/week: 0.0 standard drinks of alcohol    Comment: Drinks 1 beer every few weeks to month   Drug use: No   Sexual activity: Not on file  Other Topics Concern  Not on file  Social History Narrative   Not on file   Social Determinants of Health   Financial Resource Strain: Not on file  Food Insecurity: Not on file  Transportation Needs: Not on file  Physical Activity: Not on file  Stress: Not on file  Social Connections: Not on file  Intimate Partner Violence: Not on file     Current Outpatient Medications:    amLODipine (NORVASC) 5 MG tablet, TAKE 1 TABLET(5 MG) BY MOUTH DAILY, Disp: 90 tablet, Rfl: 3   aspirin 81 MG tablet, Take 81 mg by mouth daily., Disp: , Rfl:    atorvastatin (LIPITOR) 80 MG tablet, daily, Disp: 90 tablet, Rfl: 3   ciprofloxacin (CIPRO) 500 MG tablet, Take 1 tablet (500 mg total) by mouth 2 (two) times daily for 10 days., Disp: 20 tablet, Rfl: 0   meloxicam (MOBIC) 15 MG tablet, Take 1 tablet (15  mg total) by mouth daily., Disp: 30 tablet, Rfl: 0   metoprolol succinate (TOPROL-XL) 100 MG 24 hr tablet, TAKE 1 TABLET(100 MG) BY MOUTH DAILY, Disp: 90 tablet, Rfl: 3   metroNIDAZOLE (METROCREAM) 0.75 % cream, Apply 1 Application topically 2 (two) times daily., Disp: 45 g, Rfl: 3   tadalafil (CIALIS) 20 MG tablet, Take 1 tablet PO Daily PRN, Disp: 20 tablet, Rfl: 6   tamsulosin (FLOMAX) 0.4 MG CAPS capsule, Take 2 capsules (0.8 mg total) by mouth daily., Disp: 180 capsule, Rfl: 3   nitroGLYCERIN (NITROSTAT) 0.4 MG SL tablet, Place 1 tablet (0.4 mg total) under the tongue every 5 (five) minutes as needed for chest pain., Disp: 30 tablet, Rfl: 5   Allergies  Allergen Reactions   Codeine Other (See Comments)    HA/migraine   Penicillins Other (See Comments)    Elevated blood pressure    ROS Review of Systems  Constitutional:  Negative for chills.  Gastrointestinal:  Negative for vomiting.  Genitourinary:  Positive for frequency. Negative for flank pain and hematuria.      Objective:    Physical Exam  BP 132/76   Pulse 84   Ht _0  (1.778 m)   Wt 264 lb 6.4 oz (119.9 kg)   BMI 37.94 kg/m  Wt Readings from Last 3 Encounters:  10/16/22 264 lb 6.4 oz (119.9 kg)  08/20/22 269 lb 4.8 oz (122.2 kg)  08/05/22 270 lb 9.6 oz (122.7 kg)     Health Maintenance Due  Topic Date Due   COVID-19 Vaccine (1) Never done   HIV Screening  Never done   Hepatitis C Screening  Never done   TETANUS/TDAP  Never done   Zoster Vaccines- Shingrix (1 of 2) Never done   Lung Cancer Screening  06/13/2020   INFLUENZA VACCINE  07/16/2022    There are no preventive care reminders to display for this patient.  Lab Results  Component Value Date   TSH 2.80 08/05/2022   Lab Results  Component Value Date   WBC 7.5 08/05/2022   HGB 15.0 08/05/2022   HCT 44.2 08/05/2022   MCV 94.0 08/05/2022   PLT 228 08/05/2022   Lab Results  Component Value Date   NA 137 08/05/2022   K 4.0 08/05/2022    CO2 23 08/05/2022   GLUCOSE 114 (H) 08/05/2022   BUN 12 08/05/2022   CREATININE 0.94 08/05/2022   BILITOT 0.5 08/05/2022   AST 19 08/05/2022   ALT 25 08/05/2022   PROT 7.1 08/05/2022   CALCIUM 9.7 08/05/2022   EGFR 92 08/05/2022  Lab Results  Component Value Date   CHOL 84 08/05/2022   Lab Results  Component Value Date   HDL 27 (L) 08/05/2022   Lab Results  Component Value Date   LDLCALC 37 08/05/2022   Lab Results  Component Value Date   TRIG 122 08/05/2022   Lab Results  Component Value Date   CHOLHDL 3.1 08/05/2022   No results found for: "HGBA1C"    Assessment & Plan:   Problem List Items Addressed This Visit       Cardiovascular and Mediastinum   Coronary artery calcification    Suggest to lose weight and follow low-cholesterol diet      Essential hypertension - Primary     Patient denies any chest pain or shortness of breath there is no history of palpitation or paroxysmal nocturnal dyspnea   patient was advised to follow low-salt low-cholesterol diet    ideally I want to keep systolic blood pressure below 130 mmHg, patient was asked to check blood pressure one times a week and give me a report on that.  Patient will be follow-up in 3 months  or earlier as needed, patient will call me back for any change in the cardiovascular symptoms Patient was advised to buy a book from local bookstore concerning blood pressure and read several chapters  every day.  This will be supplemented by some of the material we will give him from the office.  Patient should also utilize other resources like YouTube and Internet to learn more about the blood pressure and the diet.        Genitourinary   Acute cystitis without hematuria    We will start the patient on Cipro        Other   Tobacco abuse    - I instructed the patient to stop smoking and provided them with smoking cessation materials.  - I informed the patient that smoking puts them at increased risk for  cancer, COPD, hypertension, and more.  - Informed the patient to seek help if they begin to have trouble breathing, develop chest pain, start to cough up blood, feel faint, or pass out.      Hyperlipidemia    Hypercholesterolemia  I advised the patient to follow Mediterranean diet This diet is rich in fruits vegetables and whole grain, and This diet is also rich in fish and lean meat Patient should also eat a handful of almonds or walnuts daily Recent heart study indicated that average follow-up on this kind of diet reduces the cardiovascular mortality by 50 to 70%==      Class 2 obesity due to excess calories without serious comorbidity in adult    Ordered left renal disease Procedure Dr. Norva Pavlov has no I can discuss the results- I encouraged the patient to lose weight.  - I educated them on making healthy dietary choices including eating more fruits and vegetables and less fried foods. - I encouraged the patient to exercise more, and educated on the benefits of exercise including weight loss, diabetes prevention, and hypertension prevention.   Dietary counseling with a registered dietician  Referral to a weight management support group (e.g. Weight Watchers, Overeaters Anonymous)  If your BMI is greater than 29 or you have gained more than 15 pounds you should work on weight loss.  Attend a healthy cooking class        Meds ordered this encounter  Medications   ciprofloxacin (CIPRO) 500 MG tablet    Sig: Take 1 tablet (  500 mg total) by mouth 2 (two) times daily for 10 days.    Dispense:  20 tablet    Refill:  0    Follow-up: No follow-ups on file.    Cletis Athens, MD

## 2022-11-18 ENCOUNTER — Ambulatory Visit: Payer: 59 | Admitting: Internal Medicine

## 2023-03-03 ENCOUNTER — Other Ambulatory Visit: Payer: Self-pay

## 2023-03-03 ENCOUNTER — Emergency Department: Payer: 59

## 2023-03-03 DIAGNOSIS — K5730 Diverticulosis of large intestine without perforation or abscess without bleeding: Secondary | ICD-10-CM | POA: Diagnosis not present

## 2023-03-03 DIAGNOSIS — R109 Unspecified abdominal pain: Secondary | ICD-10-CM | POA: Diagnosis not present

## 2023-03-03 DIAGNOSIS — N132 Hydronephrosis with renal and ureteral calculous obstruction: Secondary | ICD-10-CM | POA: Diagnosis not present

## 2023-03-03 DIAGNOSIS — Z79899 Other long term (current) drug therapy: Secondary | ICD-10-CM | POA: Diagnosis not present

## 2023-03-03 DIAGNOSIS — Z7982 Long term (current) use of aspirin: Secondary | ICD-10-CM | POA: Insufficient documentation

## 2023-03-03 DIAGNOSIS — N200 Calculus of kidney: Secondary | ICD-10-CM | POA: Diagnosis not present

## 2023-03-03 LAB — URINALYSIS, ROUTINE W REFLEX MICROSCOPIC
Bacteria, UA: NONE SEEN
Bilirubin Urine: NEGATIVE
Glucose, UA: NEGATIVE mg/dL
Ketones, ur: NEGATIVE mg/dL
Nitrite: NEGATIVE
Protein, ur: NEGATIVE mg/dL
Specific Gravity, Urine: 1.017 (ref 1.005–1.030)
pH: 5 (ref 5.0–8.0)

## 2023-03-03 LAB — CBC
HCT: 43.6 % (ref 39.0–52.0)
Hemoglobin: 15.1 g/dL (ref 13.0–17.0)
MCH: 32.3 pg (ref 26.0–34.0)
MCHC: 34.6 g/dL (ref 30.0–36.0)
MCV: 93.2 fL (ref 80.0–100.0)
Platelets: 231 10*3/uL (ref 150–400)
RBC: 4.68 MIL/uL (ref 4.22–5.81)
RDW: 12.4 % (ref 11.5–15.5)
WBC: 10.4 10*3/uL (ref 4.0–10.5)
nRBC: 0 % (ref 0.0–0.2)

## 2023-03-03 LAB — BASIC METABOLIC PANEL
Anion gap: 10 (ref 5–15)
BUN: 15 mg/dL (ref 8–23)
CO2: 24 mmol/L (ref 22–32)
Calcium: 9.3 mg/dL (ref 8.9–10.3)
Chloride: 103 mmol/L (ref 98–111)
Creatinine, Ser: 0.94 mg/dL (ref 0.61–1.24)
GFR, Estimated: >60 mL/min (ref >60)
Glucose, Bld: 125 mg/dL — ABNORMAL HIGH (ref 70–99)
Potassium: 3.8 mmol/L (ref 3.5–5.1)
Sodium: 137 mmol/L (ref 135–145)

## 2023-03-03 MED ORDER — ONDANSETRON 4 MG PO TBDP
4.0000 mg | ORAL_TABLET | Freq: Once | ORAL | Status: AC
  Administered 2023-03-03: 4 mg via ORAL
  Filled 2023-03-03: qty 1

## 2023-03-03 MED ORDER — OXYCODONE-ACETAMINOPHEN 5-325 MG PO TABS
1.0000 | ORAL_TABLET | ORAL | Status: DC | PRN
  Administered 2023-03-03: 1 via ORAL
  Filled 2023-03-03 (×2): qty 1

## 2023-03-03 NOTE — ED Triage Notes (Signed)
Pt presents to ER with c/o BIL flank pain and bladder pain that started suddenly around 1800 tonight.  Pt endorses hx of kidney stones and UTIs in the past.  Pt states he feels like he has to urinate, but has been unable too.  Pt endorses n/v associated with the pain.  Pt is otherwise A&O x4 and appears uncomfortable in triage.

## 2023-03-04 ENCOUNTER — Emergency Department
Admission: EM | Admit: 2023-03-04 | Discharge: 2023-03-04 | Disposition: A | Discharge status: Home / Self Care | Payer: 59 | Attending: Emergency Medicine | Admitting: Emergency Medicine

## 2023-03-04 DIAGNOSIS — N200 Calculus of kidney: Secondary | ICD-10-CM

## 2023-03-04 MED ORDER — ONDANSETRON 4 MG PO TBDP: 4.0000 mg | ORAL_TABLET | Freq: Four times a day (QID) | ORAL | 0 refills | Status: DC | PRN

## 2023-03-04 MED ORDER — OXYCODONE HCL 5 MG PO TABS
5.0000 mg | ORAL_TABLET | Freq: Once | ORAL | Status: AC
  Administered 2023-03-04: 5 mg via ORAL
  Filled 2023-03-04: qty 1

## 2023-03-04 MED ORDER — IBUPROFEN 800 MG PO TABS: 800.0000 mg | ORAL_TABLET | Freq: Three times a day (TID) | ORAL | 0 refills | Status: DC | PRN

## 2023-03-04 MED ORDER — OXYCODONE-ACETAMINOPHEN 5-325 MG PO TABS: 2.0000 | ORAL_TABLET | Freq: Four times a day (QID) | ORAL | 0 refills | Status: DC | PRN

## 2023-03-04 MED ORDER — IBUPROFEN 800 MG PO TABS
800.0000 mg | ORAL_TABLET | Freq: Once | ORAL | Status: AC
  Administered 2023-03-04: 800 mg via ORAL
  Filled 2023-03-04: qty 1

## 2023-03-04 MED ORDER — OXYCODONE-ACETAMINOPHEN 5-325 MG PO TABS
1.0000 | ORAL_TABLET | Freq: Once | ORAL | Status: AC
  Administered 2023-03-04: 1 via ORAL
  Filled 2023-03-04: qty 1

## 2023-03-04 NOTE — ED Provider Notes (Signed)
[facility] Provider Note    Event Date/Time   First MD Initiated Contact with Patient 03/04/23 0123     (approximate)   History   Flank Pain   HPI  Cody Matthews is a 62 y.o. male with history of kidney stones, hyperlipidemia, emphysema, prior appendectomy who presents to the emergency department with complaints of right flank pain, nausea and vomiting.  States he fell like he could not urinate.  Has had kidney stones before but states this felt different.  Has had previous appendectomy and abdominal hernia repair.   History provided by patient, significant other.    Past Medical History:  Diagnosis Date   Appendicitis    BPH (benign prostatic hyperplasia)    Emphysema lung (HCC)    Hernia, incisional    Hyperlipidemia    Lung nodules    Rosacea    Sprain    rotator cuff   Wears dentures    full upper and lower    Past Surgical History:  Procedure Laterality Date   ABDOMINAL HERNIA REPAIR  2009   APPENDECTOMY     CARDIAC CATHETERIZATION  approx 2000   no issues   CATARACT EXTRACTION W/PHACO Right 01/29/2021   Procedure: CATARACT EXTRACTION PHACO AND INTRAOCULAR LENS PLACEMENT (IOC) RIGHT 3.86 00:30.4;  Surgeon: Eulogio Bear, MD;  Location: [place];  Service: Ophthalmology;  Laterality: Right;   CATARACT EXTRACTION W/PHACO Left 03/19/2021   Procedure: CATARACT EXTRACTION PHACO AND INTRAOCULAR LENS PLACEMENT (IOC) LEFT 2.49 00:24.8;  Surgeon: Eulogio Bear, MD;  Location: [place];  Service: Ophthalmology;  Laterality: Left;   COLONOSCOPY WITH PROPOFOL N/A 10/19/2015   Procedure: COLONOSCOPY WITH PROPOFOL;  Surgeon: Lucilla Lame, MD;  Location: [place];  Service: Endoscopy;  Laterality: N/A;   POLYPECTOMY  10/19/2015   Procedure: POLYPECTOMY;  Surgeon: Lucilla Lame, MD;  Location: [place];  Service: Endoscopy;;   SHOULDER SURGERY Left 2014, 2015    MEDICATIONS:  Prior to Admission  medications   Medication Sig Start Date End Date Taking? Authorizing Provider  amLODipine (NORVASC) 5 MG tablet TAKE 1 TABLET(5 MG) BY MOUTH DAILY 08/05/22   Cletis Athens, MD  aspirin 81 MG tablet Take 81 mg by mouth daily.    [provider]  atorvastatin (LIPITOR) 80 MG tablet daily 08/05/22   Cletis Athens, MD  meloxicam (MOBIC) 15 MG tablet Take 1 tablet (15 mg total) by mouth daily. 06/28/22   Margarette Canada, NP  metoprolol succinate (TOPROL-XL) 100 MG 24 hr tablet TAKE 1 TABLET(100 MG) BY MOUTH DAILY 08/05/22   Cletis Athens, MD  metroNIDAZOLE (METROCREAM) 0.75 % cream Apply 1 Application topically 2 (two) times daily. 08/05/22   Cletis Athens, MD  nitroGLYCERIN (NITROSTAT) 0.4 MG SL tablet Place 1 tablet (0.4 mg total) under the tongue every 5 (five) minutes as needed for chest pain. 11/13/16 09/20/19  End, Harrell Gave, MD  tadalafil (CIALIS) 20 MG tablet Take 1 tablet PO Daily PRN 08/05/22   Cletis Athens, MD  tamsulosin (FLOMAX) 0.4 MG CAPS capsule Take 2 capsules (0.8 mg total) by mouth daily. 08/05/22   Cletis Athens, MD    Physical Exam   Triage Vital Signs: ED Triage Vitals  Enc Vitals Group     BP 03/03/23 2120 (!) 189/69     Pulse Rate 03/03/23 2120 61     Resp 03/03/23 2120 20     Temp 03/03/23 2120 98 F (36.7 C)     Temp  Source 03/03/23 2120 Oral     SpO2 03/03/23 2120 94 %     Weight 03/03/23 2122 255 lb (115.7 kg)     Height --      Head Circumference --      Peak Flow --      Pain Score 03/03/23 2122 10     Pain Loc --      Pain Edu? --      Excl. in [place]? --     Most recent vital signs: Vitals:   03/03/23 2120 03/04/23 0243  BP: (!) 189/69 131/63  Pulse: 61 (!) 56  Resp: 20 17  Temp: 98 F (36.7 C) 98.2 F (36.8 C)  SpO2: 94% 96%    CONSTITUTIONAL: Alert, responds appropriately to questions. Well-appearing; well-nourished HEAD: Normocephalic, atraumatic EYES: Conjunctivae clear, pupils appear equal, sclera nonicteric ENT: normal nose; moist mucous  membranes NECK: Supple, normal ROM CARD: RRR; S1 and S2 appreciated RESP: Normal chest excursion without splinting or tachypnea; breath sounds clear and equal bilaterally; no wheezes, no rhonchi, no rales, no hypoxia or respiratory distress, speaking full sentences ABD/GI: Non-distended; soft, non-tender, no rebound, no guarding, no peritoneal signs BACK: The back appears normal EXT: Normal ROM in all joints; no deformity noted, no edema SKIN: Normal color for age and race; warm; no rash on exposed skin NEURO: Moves all extremities equally, normal speech PSYCH: The patient's mood and manner are appropriate.   ED Results / Procedures / Treatments   LABS: (all labs ordered are listed, but only abnormal results are displayed) Labs Reviewed  BASIC METABOLIC PANEL - Abnormal; Notable for the following components:      Result Value   Glucose, Bld 125 (*)    All other components within normal limits  URINALYSIS, ROUTINE W REFLEX MICROSCOPIC - Abnormal; Notable for the following components:   Color, Urine YELLOW (*)    APPearance CLEAR (*)    Hgb urine dipstick LARGE (*)    Leukocytes,Ua SMALL (*)    All other components within normal limits  URINE CULTURE  CBC     EKG:   RADIOLOGY: My personal review and interpretation of imaging: CT scan shows likely recently passed stone.  I have personally reviewed all radiology reports.   CT Renal Stone Study  Result Date: 03/03/2023 CLINICAL DATA:  Abdominal pain. EXAM: CT ABDOMEN AND PELVIS WITHOUT CONTRAST TECHNIQUE: Multidetector CT imaging of the abdomen and pelvis was performed following the standard protocol without IV contrast. RADIATION DOSE REDUCTION: This exam was performed according to the departmental dose-optimization program which includes automated exposure control, adjustment of the mA and/or kV according to patient size and/or use of iterative reconstruction technique. COMPARISON:  CT abdomen pelvis dated 06/20/2008. FINDINGS:  Evaluation of this exam is limited in the absence of intravenous contrast. Lower chest: The visualized lung bases are clear. There is coronary vascular calcification. No intra-abdominal free air or free fluid. Hepatobiliary: No focal liver abnormality is seen. No gallstones, gallbladder wall thickening, or biliary dilatation. Pancreas: Unremarkable. No pancreatic ductal dilatation or surrounding inflammatory changes. Spleen: Normal in size without focal abnormality. Adrenals/Urinary Tract: The adrenal glands are unremarkable. There is a 15 mm right renal upper pole cyst. There is a punctate stone along the right posterior bladder wall adjacent to the ureterovesical junction which may represent a recently passed stone versus a right UVJ calculus. There is mild right hydronephrosis. The left kidney, left ureter appears unremarkable. The urinary bladder is collapsed. Stomach/Bowel: There is sigmoid diverticulosis without  active inflammatory changes. There is moderate stool throughout the colon. There is no bowel obstruction or active inflammation. Appendectomy. Vascular/Lymphatic: Moderate aortoiliac atherosclerotic disease. The IVC is unremarkable. No portal venous gas. There is no adenopathy. Reproductive: The prostate and seminal vesicles are grossly unremarkable. No pelvic mass. Other: Midline vertical anterior abdominal wall incisional scar. Musculoskeletal: Degenerative changes the spine. No acute osseous pathology. IMPRESSION: 1. A punctate recently passed right renal stone versus a right UVJ calculus with mild right hydronephrosis. 2. Sigmoid diverticulosis. No bowel obstruction. 3.  Aortic Atherosclerosis (ICD10-I70.0). Electronically Signed   By: Anner Crete M.D.   On: 03/03/2023 21:50     PROCEDURES:  Critical Care performed: No      Procedures    IMPRESSION / MDM / ASSESSMENT AND PLAN / ED COURSE  I reviewed the triage vital signs and the nursing notes.    Patient here with right  flank pain, vomiting, difficulty urinating.  The patient is on the cardiac monitor to evaluate for evidence of arrhythmia and/or significant heart rate changes.   DIFFERENTIAL DIAGNOSIS (includes but not limited to):   Kidney stone, UTI, pyelonephritis, gastroenteritis, bowel obstruction   Patient's presentation is most consistent with acute presentation with potential threat to life or bodily function.   PLAN: Workup initiated from triage.  No leukocytosis, normal electrolytes and creatinine.  Urine shows white blood cells, red blood cells but no bacteria.  Will add on a urine culture.  CT scan reviewed and interpreted by myself and radiologist and shows a punctate stone either recently passed versus still in the right UVJ.  Discussed mild right hydronephrosis.  No other acute abnormality.  Patient feeling better after oral medications waiting room but still having 5/10 pain.  Have offered IV versus IM versus oral medications and he will perform oral medications.  Will also p.o. challenge.  Will give urine strainer, urology follow-up but anticipate discharge home.   MEDICATIONS GIVEN IN ED: Medications  ondansetron (ZOFRAN-ODT) disintegrating tablet 4 mg (4 mg Oral Given 03/03/23 2129)  oxyCODONE-acetaminophen (PERCOCET/ROXICET) 5-325 MG per tablet 1 tablet (1 tablet Oral Given 03/04/23 0241)    And  oxyCODONE (Oxy IR/ROXICODONE) immediate release tablet 5 mg (5 mg Oral Given 03/04/23 0241)  ibuprofen (ADVIL) tablet 800 mg (800 mg Oral Given 03/04/23 0241)     ED COURSE: Patient feeling better and requesting discharge home.  Will discharge with Percocet, ibuprofen and Zofran.  Already on Flomax.  Will give urology follow-up.  At this time, I do not feel there is any life-threatening condition present. I reviewed all nursing notes, vitals, pertinent previous records.  All lab and urine results, EKGs, imaging ordered have been independently reviewed and interpreted by myself.  I reviewed all  available radiology reports from any imaging ordered this visit.  Based on my assessment, I feel the patient is safe to be discharged home without further emergent workup and can continue workup as an outpatient as needed. Discussed all findings, treatment plan as well as usual and customary return precautions.  They verbalize understanding and are comfortable with this plan.  Outpatient follow-up has been provided as needed.  All questions have been answered.    CONSULTS:  none   OUTSIDE RECORDS REVIEWED: Reviewed last internal medicine note on 10/16/2022.       FINAL CLINICAL IMPRESSION(S) / ED DIAGNOSES   Final diagnoses:  Kidney stone     Rx / DC Orders   ED Discharge Orders  Ordered    oxyCODONE-acetaminophen (PERCOCET) 5-325 MG tablet  Every 6 hours PRN        03/04/23 0257    ibuprofen (ADVIL) 800 MG tablet  Every 8 hours PRN        03/04/23 0257    ondansetron (ZOFRAN-ODT) 4 MG disintegrating tablet  Every 6 hours PRN        03/04/23 0257             Note:  This document was prepared using Dragon voice recognition software and may include unintentional dictation errors.   Emiah Pellicano, Delice Bison, DO 03/04/23 [phone]

## 2023-03-04 NOTE — Discharge Instructions (Addendum)

## 2023-03-04 NOTE — ED Notes (Signed)
Urine strainer provided as ordered by Ward, MD

## 2023-03-04 NOTE — ED Notes (Signed)
Pt rpts chronic hx of bradycardia.  Pt sts "I am currently taking medication so that my heart rate will not get too low or too high"

## 2023-03-05 LAB — URINE CULTURE

## 2023-09-17 ENCOUNTER — Other Ambulatory Visit: Payer: Self-pay

## 2023-09-17 DIAGNOSIS — Z87891 Personal history of nicotine dependence: Secondary | ICD-10-CM

## 2023-09-17 DIAGNOSIS — Z122 Encounter for screening for malignant neoplasm of respiratory organs: Secondary | ICD-10-CM

## 2023-10-01 ENCOUNTER — Ambulatory Visit
Admission: RE | Admit: 2023-10-01 | Discharge: 2023-10-01 | Disposition: A | Discharge status: Home / Self Care | Payer: 59 | Source: Ambulatory Visit | Attending: Internal Medicine | Admitting: Internal Medicine

## 2023-10-01 DIAGNOSIS — Z122 Encounter for screening for malignant neoplasm of respiratory organs: Secondary | ICD-10-CM

## 2023-10-01 DIAGNOSIS — Z87891 Personal history of nicotine dependence: Secondary | ICD-10-CM | POA: Diagnosis not present

## 2023-10-23 ENCOUNTER — Other Ambulatory Visit: Payer: Self-pay | Admitting: Acute Care

## 2023-10-23 DIAGNOSIS — Z122 Encounter for screening for malignant neoplasm of respiratory organs: Secondary | ICD-10-CM

## 2023-10-23 DIAGNOSIS — Z87891 Personal history of nicotine dependence: Secondary | ICD-10-CM

## 2024-02-04 ENCOUNTER — Ambulatory Visit (INDEPENDENT_AMBULATORY_CARE_PROVIDER_SITE_OTHER): Payer: 59 | Admitting: Family Medicine

## 2024-02-04 ENCOUNTER — Encounter: Payer: Self-pay | Admitting: Family Medicine

## 2024-02-04 VITALS — BP 130/73 | HR 69 | Temp 98.3°F | Resp 18 | Ht 70.0 in | Wt 265.0 lb

## 2024-02-04 DIAGNOSIS — M10042 Idiopathic gout, left hand: Secondary | ICD-10-CM

## 2024-02-04 DIAGNOSIS — N401 Enlarged prostate with lower urinary tract symptoms: Secondary | ICD-10-CM

## 2024-02-04 DIAGNOSIS — Z23 Encounter for immunization: Secondary | ICD-10-CM | POA: Diagnosis not present

## 2024-02-04 DIAGNOSIS — R351 Nocturia: Secondary | ICD-10-CM

## 2024-02-04 DIAGNOSIS — D225 Melanocytic nevi of trunk: Secondary | ICD-10-CM | POA: Diagnosis not present

## 2024-02-04 DIAGNOSIS — M1A4320 Other secondary chronic gout, left wrist, without tophus (tophi): Secondary | ICD-10-CM

## 2024-02-04 DIAGNOSIS — R0789 Other chest pain: Secondary | ICD-10-CM

## 2024-02-04 DIAGNOSIS — M10032 Idiopathic gout, left wrist: Secondary | ICD-10-CM

## 2024-02-04 DIAGNOSIS — E785 Hyperlipidemia, unspecified: Secondary | ICD-10-CM | POA: Diagnosis not present

## 2024-02-04 DIAGNOSIS — I10 Essential (primary) hypertension: Secondary | ICD-10-CM

## 2024-02-04 DIAGNOSIS — Z720 Tobacco use: Secondary | ICD-10-CM

## 2024-02-04 DIAGNOSIS — F17210 Nicotine dependence, cigarettes, uncomplicated: Secondary | ICD-10-CM

## 2024-02-04 NOTE — Assessment & Plan Note (Signed)
Is smoking 4 to 5 cigarettes a day now but also chewing tobacco.  Does get LDCT annually in October.  Last scan 10.24.

## 2024-02-04 NOTE — Assessment & Plan Note (Signed)
Takes amlodipine 5 mg and metoprolol succinate 100 mg daily.  Blood pressure is very well-controlled at 130/73 and pulse 69.

## 2024-02-04 NOTE — Assessment & Plan Note (Signed)
Checking his uric acid level.  Currently takes meloxicam when he feels the gout coming on.  This works for him.

## 2024-02-04 NOTE — Assessment & Plan Note (Signed)
Has a sensation of something uncomfortable but last a few seconds and has no associated symptoms.  Referral to cardiology, Dr. Okey Dupre.

## 2024-02-04 NOTE — Assessment & Plan Note (Signed)
On atorvastatin 80 mg daily and tolerating dosage.  Will check lipids and CMP

## 2024-02-04 NOTE — Assessment & Plan Note (Signed)
Has 2 dark brown moles on his back that have irregular borders.  Have asked him to come back in for biopsy as referral to dermatology takes months.

## 2024-02-04 NOTE — Progress Notes (Signed)
New Patient Office Visit  Subjective    Patient ID: Cody Matthews, male    DOB: May 14, 1961  Age: 63 y.o. MRN: [id number]  CC:  Chief Complaint  Patient presents with   Establish Care    HPI [name] presents to establish care.  Delightful 63 year old man with history of gout, kidney stones, hypertension, COPD/emphysema, mixed hyperlipidemia and history of shingles. He gets gout in his left wrist and hand (he is left-handed) and takes meloxicam for that he has had 3 occurrences. He gets a kidney stone every year or 2.  He also has BPH and is on tamsulosin.  In 3/24 he was unable to void because of pain from a kidney stone.  Went to the ED and got pain medication and then was able to void.  Was told to follow-up with urology but has not.  Request referral to urology. He is a smoker now he smokes lightly.  1 pack will last 3 to 4 days.  He is chewing more tobacco.  Has tried vaping and did not like it.  He gets a low-dose CT scan of the chest in October each year. Takes no medications for COPD. Has hypertension and is on amlodipine 5 mg and metoprolol succinate 100 mg.  He is followed by Dr. Okey Dupre in cardiology.  Every once in a while he feels something that is uncomfortable in his chest.  It lasts for a few seconds only.  Not associated with nausea, diaphoresis, dizziness, shortness of breath. He has elevated cholesterol and takes Lipitor 80 mg.  He tolerates this dosage fine. Had a Cologuard in 2024 that was negative. Has a history of appendectomy, bilateral cataract surgery, colonoscopy in 2016, had a cardiac cath in 2000 and they found minimal blockages.  Rotator cuff surgeries bilaterally His mother has recently been diagnosed with diabetes but he reports every time he has been tested he does not have diabetes. He is blonde headed with blue eyes he saw dermatologist in 2015 for rosacea he denies anything new or different on his skin anything growing itching bleeding or will not  heal. He agrees to a flu vaccine and shingles vaccine today PHQ 9 0 GAD-7 is 0 He is a retired [employer].   Outpatient Encounter Medications as of 02/04/2024  Medication Sig   amLODipine (NORVASC) 5 MG tablet TAKE 1 TABLET(5 MG) BY MOUTH DAILY   aspirin 81 MG tablet Take 81 mg by mouth daily.   atorvastatin (LIPITOR) 80 MG tablet daily   ibuprofen (ADVIL) 800 MG tablet Take 1 tablet (800 mg total) by mouth every 8 (eight) hours as needed.   metoprolol succinate (TOPROL-XL) 100 MG 24 hr tablet TAKE 1 TABLET(100 MG) BY MOUTH DAILY   metroNIDAZOLE (METROCREAM) 0.75 % cream Apply 1 Application topically 2 (two) times daily.   tadalafil (CIALIS) 20 MG tablet Take 1 tablet PO Daily PRN   tamsulosin (FLOMAX) 0.4 MG CAPS capsule Take 2 capsules (0.8 mg total) by mouth daily.   meloxicam (MOBIC) 15 MG tablet Take 1 tablet (15 mg total) by mouth daily. (Patient not taking: Reported on 02/04/2024)   nitroGLYCERIN (NITROSTAT) 0.4 MG SL tablet Place 1 tablet (0.4 mg total) under the tongue every 5 (five) minutes as needed for chest pain. (Patient not taking: Reported on 02/04/2024)   ondansetron (ZOFRAN-ODT) 4 MG disintegrating tablet Take 1 tablet (4 mg total) by mouth every 6 (six) hours as needed for nausea or vomiting. (Patient not taking: Reported on 02/04/2024)  oxyCODONE-acetaminophen (PERCOCET) 5-325 MG tablet Take 2 tablets by mouth every 6 (six) hours as needed for severe pain. (Patient not taking: Reported on 02/04/2024)   No facility-administered encounter medications on file as of 02/04/2024.    Past Medical History:  Diagnosis Date   Appendicitis    BPH (benign prostatic hyperplasia)    Emphysema lung (HCC)    Hernia, incisional    Hyperlipidemia    Lung nodules    Rosacea    Sprain    rotator cuff   Wears dentures    full upper and lower    Past Surgical History:  Procedure Laterality Date   ABDOMINAL HERNIA REPAIR  2009   APPENDECTOMY     CARDIAC CATHETERIZATION  approx  2000   no issues   CATARACT EXTRACTION W/PHACO Right 01/29/2021   Procedure: CATARACT EXTRACTION PHACO AND INTRAOCULAR LENS PLACEMENT (IOC) RIGHT 3.86 00:30.4;  Surgeon: Nevada Crane, MD;  Location: [facility];  Service: Ophthalmology;  Laterality: Right;   CATARACT EXTRACTION W/PHACO Left 03/19/2021   Procedure: CATARACT EXTRACTION PHACO AND INTRAOCULAR LENS PLACEMENT (IOC) LEFT 2.49 00:24.8;  Surgeon: Nevada Crane, MD;  Location: [facility];  Service: Ophthalmology;  Laterality: Left;   COLONOSCOPY WITH PROPOFOL N/A 10/19/2015   Procedure: COLONOSCOPY WITH PROPOFOL;  Surgeon: Midge Minium, MD;  Location: [facility];  Service: Endoscopy;  Laterality: N/A;   POLYPECTOMY  10/19/2015   Procedure: POLYPECTOMY;  Surgeon: Midge Minium, MD;  Location: [facility];  Service: Endoscopy;;   SHOULDER SURGERY Left 2014, 2015    Family History  Problem Relation Age of Onset   Breast cancer Mother    Emphysema Father    Heart disease Father    Heart attack Maternal Grandfather 13   Stroke Paternal Grandmother    Heart disease Paternal Grandfather        Pacemaker   Kidney cancer Neg Hx    Kidney disease Neg Hx    Prostate cancer Neg Hx     Social History   Socioeconomic History   Marital status: Married    Spouse name: Not on file   Number of children: Not on file   Years of education: Not on file   Highest education level: Not on file  Occupational History   Not on file  Tobacco Use   Smoking status: Former    Current packs/day: 0.00    Average packs/day: 1 pack/day for 38.0 years (38.0 ttl pk-yrs)    Types: Cigarettes    Start date: 09/05/1980    Quit date: 09/05/2018    Years since quitting: 5.4    Passive exposure: Past   Smokeless tobacco: Never  Vaping Use   Vaping status: Never Used  Substance and Sexual Activity   Alcohol use: Yes    Alcohol/week: 0.0 standard drinks of alcohol    Comment: Drinks 1 beer every few weeks to month    Drug use: No   Sexual activity: Not on file  Other Topics Concern   Not on file  Social History Narrative   Not on file   Social Drivers of Health   Financial Resource Strain: Not on file  Food Insecurity: Not on file  Transportation Needs: Not on file  Physical Activity: Not on file  Stress: Not on file  Social Connections: Not on file  Intimate Partner Violence: Not on file    ROS      Objective    BP 130/73 (BP Location: Left Arm, Patient  Position: Sitting, Cuff Size: Large)   Pulse 69   Temp 98.3 F (36.8 C) (Oral)   Resp 18   Ht 5\' 10"  (1.778 m)   Wt 265 lb (120.2 kg)   SpO2 94%   BMI 38.02 kg/m   Physical Exam Vitals and nursing note reviewed.  Constitutional:      Appearance: Normal appearance.  HENT:     Head: Normocephalic and atraumatic.  Eyes:     Conjunctiva/sclera: Conjunctivae normal.  Cardiovascular:     Rate and Rhythm: Normal rate and regular rhythm.  Pulmonary:     Effort: Pulmonary effort is normal.     Breath sounds: Normal breath sounds.  Musculoskeletal:     Right lower leg: No edema.     Left lower leg: No edema.  Skin:    General: Skin is warm and dry.          Comments: Dark moles with irregular borders.  Neurological:     Mental Status: He is alert and oriented to person, place, and time.  Psychiatric:        Mood and Affect: Mood normal.        Behavior: Behavior normal.        Thought Content: Thought content normal.        Judgment: Judgment normal.         Assessment & Plan:   Problem List Items Addressed This Visit       Cardiovascular and Mediastinum   Essential hypertension   Takes amlodipine 5 mg and metoprolol succinate 100 mg daily.  Blood pressure is very well-controlled at 130/73 and pulse 69.        Musculoskeletal and Integument   Acute idiopathic gout of left wrist   Checking his uric acid level.  Currently takes meloxicam when he feels the gout coming on.  This works for him.        Other    Atypical chest pain   Has a sensation of something uncomfortable but last a few seconds and has no associated symptoms.  Referral to cardiology, Dr. Okey Dupre.      Dyslipidemia   On atorvastatin 80 mg daily and tolerating dosage.  Will check lipids and CMP      Tobacco abuse   Is smoking 4 to 5 cigarettes a day now but also chewing tobacco.  Does get LDCT annually in October.  Last scan 10.24.        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 ratio     Cigarette nicotine dependence without complication           Return in about 2 weeks (around 02/18/2024) for Biopsy on back.   Alease Medina, MD

## 2024-02-05 LAB — LIPID PANEL
Chol/HDL Ratio: 2.9 {ratio} (ref 0.0–5.0)
Cholesterol, Total: 78 mg/dL — ABNORMAL LOW (ref 100–199)
HDL: 27 mg/dL — ABNORMAL LOW (ref >39)
LDL Chol Calc (NIH): 26 mg/dL (ref 0–99)
Triglycerides: 142 mg/dL (ref 0–149)
VLDL Cholesterol Cal: 25 mg/dL (ref 5–40)

## 2024-02-05 LAB — CBC WITH DIFFERENTIAL/PLATELET
Basophils Absolute: 0.1 10*3/uL (ref 0.0–0.2)
Basos: 1 %
EOS (ABSOLUTE): 0.3 10*3/uL (ref 0.0–0.4)
Eos: 2 %
Hematocrit: 45.3 % (ref 37.5–51.0)
Hemoglobin: 15.5 g/dL (ref 13.0–17.7)
Immature Grans (Abs): 0 10*3/uL (ref 0.0–0.1)
Immature Granulocytes: 0 %
Lymphocytes Absolute: 2.3 10*3/uL (ref 0.7–3.1)
Lymphs: 19 %
MCH: 31.8 pg (ref 26.6–33.0)
MCHC: 34.2 g/dL (ref 31.5–35.7)
MCV: 93 fL (ref 79–97)
Monocytes Absolute: 0.9 10*3/uL (ref 0.1–0.9)
Monocytes: 8 %
Neutrophils Absolute: 8.3 10*3/uL — ABNORMAL HIGH (ref 1.4–7.0)
Neutrophils: 70 %
Platelets: 226 10*3/uL (ref 150–450)
RBC: 4.88 x10E6/uL (ref 4.14–5.80)
RDW: 11.9 % (ref 11.6–15.4)
WBC: 11.9 10*3/uL — ABNORMAL HIGH (ref 3.4–10.8)

## 2024-02-05 LAB — CMP14+EGFR
ALT: 33 [IU]/L (ref 0–44)
AST: 21 [IU]/L (ref 0–40)
Albumin: 4.4 g/dL (ref 3.9–4.9)
Alkaline Phosphatase: 84 [IU]/L (ref 44–121)
BUN/Creatinine Ratio: 13 (ref 10–24)
BUN: 11 mg/dL (ref 8–27)
Bilirubin Total: 0.6 mg/dL (ref 0.0–1.2)
CO2: 22 mmol/L (ref 20–29)
Calcium: 9.6 mg/dL (ref 8.6–10.2)
Chloride: 99 mmol/L (ref 96–106)
Creatinine, Ser: 0.86 mg/dL (ref 0.76–1.27)
Globulin, Total: 2.7 g/dL (ref 1.5–4.5)
Glucose: 92 mg/dL (ref 70–99)
Potassium: 4.2 mmol/L (ref 3.5–5.2)
Sodium: 136 mmol/L (ref 134–144)
Total Protein: 7.1 g/dL (ref 6.0–8.5)
eGFR: 97 mL/min/{1.73_m2} (ref >59)

## 2024-02-05 LAB — TSH+FREE T4
Free T4: 1.02 ng/dL (ref 0.82–1.77)
TSH: 2.11 u[IU]/mL (ref 0.450–4.500)

## 2024-02-05 LAB — MICROALBUMIN / CREATININE URINE RATIO
Creatinine, Urine: 35.9 mg/dL
Microalb/Creat Ratio: 32 mg/g{creat} — ABNORMAL HIGH (ref 0–29)
Microalbumin, Urine: 11.5 ug/mL

## 2024-02-05 LAB — URIC ACID: Uric Acid: 4.9 mg/dL (ref 3.8–8.4)

## 2024-02-06 ENCOUNTER — Encounter: Payer: Self-pay | Admitting: Family Medicine

## 2024-02-18 ENCOUNTER — Encounter: Payer: Self-pay | Admitting: Family Medicine

## 2024-02-18 ENCOUNTER — Ambulatory Visit: Payer: 59 | Admitting: Family Medicine

## 2024-02-18 ENCOUNTER — Ambulatory Visit: Admitting: Family Medicine

## 2024-02-18 ENCOUNTER — Other Ambulatory Visit (HOSPITAL_COMMUNITY)
Admission: RE | Admit: 2024-02-18 | Discharge: 2024-02-18 | Disposition: A | Discharge status: Home / Self Care | Source: Ambulatory Visit | Attending: Family Medicine | Admitting: Family Medicine

## 2024-02-18 ENCOUNTER — Telehealth: Payer: Self-pay

## 2024-02-18 VITALS — BP 134/76 | HR 62 | Temp 97.8°F | Resp 18 | Ht 70.0 in | Wt 256.0 lb

## 2024-02-18 DIAGNOSIS — D0359 Melanoma in situ of other part of trunk: Secondary | ICD-10-CM | POA: Diagnosis not present

## 2024-02-18 DIAGNOSIS — N529 Male erectile dysfunction, unspecified: Secondary | ICD-10-CM | POA: Insufficient documentation

## 2024-02-18 DIAGNOSIS — D225 Melanocytic nevi of trunk: Secondary | ICD-10-CM

## 2024-02-18 MED ORDER — TADALAFIL 20 MG PO TABS: ORAL_TABLET | ORAL | 6 refills | Status: DC

## 2024-02-18 NOTE — Telephone Encounter (Signed)
 Please disregard previous message. Dr. Girtha Rm will be able to do procedure.

## 2024-02-18 NOTE — Assessment & Plan Note (Signed)
 Tadalafil 20mg  #60

## 2024-02-18 NOTE — Telephone Encounter (Signed)
 LVM requesting a return call regarding today's appointment that will need to be rescheduled due to not having the necessary products in office for procedure.

## 2024-02-18 NOTE — Progress Notes (Signed)
   Established Patient Office Visit  Subjective   Patient ID: Cody Matthews, male    DOB: 1961-06-23  Age: 63 y.o. MRN: [id number]  Chief Complaint  Patient presents with   Mass    On back     HPI delightful 63 year old man with HTN, kidney stones obesity, hand arthritis, ED, mixed hyperlipidemia, BPH, COPD/emphysema with atypical nevus on his upper back.  He agrees to a biopsy today.     ROS    Objective:     BP 134/76 (BP Location: Left Arm, Patient Position: Sitting, Cuff Size: Large)   Pulse 62   Temp 97.8 F (36.6 C) (Oral)   Resp 18   Ht 5\' 10"  (1.778 m)   Wt 256 lb (116.1 kg)   SpO2 94%   BMI 36.73 kg/m    Physical Exam Skin:         Comments: Atypical nevus right upper back.          No results found for any visits on 02/18/24.    The ASCVD Risk score (Arnett DK, et al., 2019) failed to calculate for the following reasons:   The valid total cholesterol range is 130 to 320 mg/dL    Assessment & Plan:  Erectile dysfunction, unspecified erectile dysfunction type Assessment & Plan: Tadalafil 20mg  #60  Orders: -     Tadalafil; Take 1 tablet PO Daily PRN  Dispense: 20 tablet; Refill: 6 -     Surgical pathology  Atypical nevus of back  Procedure:  Excision of atypical nevus of upper back Risks, benefits, and alternatives explained and consent obtained. Time out conducted. Surface prepped with alcohol. 5cc lidocaine with epinephine infiltrated in a field block. Adequate anesthesia ensured. Area prepped and draped in a sterile fashion. Excision performed with Dermablade Hemostasis achieved. Pt stable.  Remove dressing tonight.  Cover with neosporin or Vaseline and a Band-Aid.  Change dressing daily.  If redness, swelling, drainage, mucopurulent discharge or increasing pain, please return to clinic.     Return if symptoms worsen or fail to improve.    Alease Medina, MD

## 2024-02-25 LAB — SURGICAL PATHOLOGY

## 2024-02-26 ENCOUNTER — Ambulatory Visit: Admitting: Family Medicine

## 2024-02-26 ENCOUNTER — Other Ambulatory Visit: Payer: Self-pay | Admitting: Family Medicine

## 2024-02-26 DIAGNOSIS — D0359 Melanoma in situ of other part of trunk: Secondary | ICD-10-CM

## 2024-02-26 NOTE — Addendum Note (Signed)
 Addended by: Alease Medina on: 02/26/2024 09:37 AM   Modules accepted: Orders

## 2024-02-26 NOTE — Progress Notes (Signed)
 Spoke with patient.  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  Will refer to general surgery and to Dermatology for a body check.

## 2024-02-27 ENCOUNTER — Ambulatory Visit: Payer: Self-pay | Admitting: Surgery

## 2024-03-04 ENCOUNTER — Encounter: Payer: Self-pay | Admitting: Urology

## 2024-03-04 ENCOUNTER — Ambulatory Visit: Payer: 59 | Admitting: Urology

## 2024-03-04 VITALS — BP 153/72 | HR 61 | Ht 70.0 in | Wt 250.0 lb

## 2024-03-04 DIAGNOSIS — N400 Enlarged prostate without lower urinary tract symptoms: Secondary | ICD-10-CM | POA: Diagnosis not present

## 2024-03-04 DIAGNOSIS — R399 Unspecified symptoms and signs involving the genitourinary system: Secondary | ICD-10-CM | POA: Diagnosis not present

## 2024-03-04 LAB — MICROSCOPIC EXAMINATION

## 2024-03-04 LAB — URINALYSIS, ROUTINE W REFLEX MICROSCOPIC
Bilirubin, UA: NEGATIVE
Glucose, UA: NEGATIVE
Ketones, UA: NEGATIVE
Nitrite, UA: NEGATIVE
Protein,UA: NEGATIVE
RBC, UA: NEGATIVE
Specific Gravity, UA: 1.02 (ref 1.005–1.030)
Urobilinogen, Ur: 1 mg/dL (ref 0.2–1.0)
pH, UA: 7.5 (ref 5.0–7.5)

## 2024-03-04 LAB — BLADDER SCAN AMB NON-IMAGING

## 2024-03-04 MED ORDER — SULFAMETHOXAZOLE-TRIMETHOPRIM 800-160 MG PO TABS: 1.0000 | ORAL_TABLET | Freq: Two times a day (BID) | ORAL | 0 refills | Status: DC

## 2024-03-04 MED ORDER — SILODOSIN 8 MG PO CAPS: 8.0000 mg | ORAL_CAPSULE | Freq: Every day | ORAL | 11 refills | Status: AC

## 2024-03-04 NOTE — Addendum Note (Signed)
 Addended by: Carolin Coy on: 03/04/2024 12:17 PM   Modules accepted: Orders

## 2024-03-04 NOTE — Addendum Note (Signed)
 Addended by: Carolin Coy on: 03/04/2024 10:42 AM   Modules accepted: Orders

## 2024-03-04 NOTE — Progress Notes (Signed)
 Assessment: 1. Lower urinary tract symptoms (LUTS)      Plan: Urine for culture today We will treat empirically with Bactrim DS twice daily for 10 days Will also trial alternative medical therapy for BPH. Rx: Silodosin 8 mg daily Follow-up 8 weeks with PSA prior to visit  Chief Complaint:   History of Present Illness:  Cody Matthews is a 63 y.o. male with a past medical history of  gout, kidney stones, hypertension, COPD/emphysema, mixed hyperlipidemia and history of shingles. who is seen in consultation from Ziglar, Eli Phillips, MD for evaluation of LUTS.  Patient has long standing BPH/lower urinary tract symptoms and has been on tamsulosin since 2015.  Over the last year or 2 it has lost its effectiveness despite doubling up to 0.8 mg daily. UA shows pyruia and mod bacteria.  He denies dysuria.  Current IPSS = 21 PVR today shows minimal residual  No family history of prostate cancer DRE demonstrates approximate 40 g gland without evidence of nodules or induration  Only known prior PSA 07/2022= 3.43   Past Medical History:  Past Medical History:  Diagnosis Date   Appendicitis    BPH (benign prostatic hyperplasia)    Emphysema lung (HCC)    Hernia, incisional    Hyperlipidemia    Lung nodules    Rosacea    Sprain    rotator cuff   Wears dentures    full upper and lower    Past Surgical History:  Past Surgical History:  Procedure Laterality Date   ABDOMINAL HERNIA REPAIR  2009   APPENDECTOMY     CARDIAC CATHETERIZATION  approx 2000   no issues   CATARACT EXTRACTION W/PHACO Right 01/29/2021   Procedure: CATARACT EXTRACTION PHACO AND INTRAOCULAR LENS PLACEMENT (IOC) RIGHT 3.86 00:30.4;  Surgeon: Nevada Crane, MD;  Location: [facility];  Service: Ophthalmology;  Laterality: Right;   CATARACT EXTRACTION W/PHACO Left 03/19/2021   Procedure: CATARACT EXTRACTION PHACO AND INTRAOCULAR LENS PLACEMENT (IOC) LEFT 2.49 00:24.8;  Surgeon: Nevada Crane, MD;   Location: [facility];  Service: Ophthalmology;  Laterality: Left;   COLONOSCOPY WITH PROPOFOL N/A 10/19/2015   Procedure: COLONOSCOPY WITH PROPOFOL;  Surgeon: Midge Minium, MD;  Location: [facility];  Service: Endoscopy;  Laterality: N/A;   POLYPECTOMY  10/19/2015   Procedure: POLYPECTOMY;  Surgeon: Midge Minium, MD;  Location: [facility];  Service: Endoscopy;;   SHOULDER SURGERY Left 2014, 2015    Allergies:  Allergies  Allergen Reactions   Codeine Other (See Comments)    HA/migraine   Penicillins Other (See Comments)    Elevated blood pressure    Family History:  Family History  Problem Relation Age of Onset   Breast cancer Mother    Emphysema Father    Heart disease Father    Heart attack Maternal Grandfather 51   Stroke Paternal Grandmother    Heart disease Paternal Grandfather        Pacemaker   Kidney cancer Neg Hx    Kidney disease Neg Hx    Prostate cancer Neg Hx     Social History:  Social History   Tobacco Use   Smoking status: Former    Current packs/day: 0.00    Average packs/day: 1 pack/day for 38.0 years (38.0 ttl pk-yrs)    Types: Cigarettes    Start date: 09/05/1980    Quit date: 09/05/2018    Years since quitting: 5.4    Passive exposure: Past   Smokeless tobacco:  Never  Vaping Use   Vaping status: Never Used  Substance Use Topics   Alcohol use: Yes    Alcohol/week: 0.0 standard drinks of alcohol    Comment: Drinks 1 beer every few weeks to month   Drug use: No    Review of symptoms:  Constitutional:  Negative for unexplained weight loss, night sweats, fever, chills ENT:  Negative for nose bleeds, sinus pain, painful swallowing CV:  Negative for chest pain, shortness of breath, exercise intolerance, palpitations, loss of consciousness Resp:  Negative for cough, wheezing, shortness of breath GI:  Negative for nausea, vomiting, diarrhea, bloody stools GU:  Positives noted in HPI; otherwise negative for gross hematuria,  dysuria, urinary incontinence Neuro:  Negative for seizures, poor balance, limb weakness, slurred speech Psych:  Negative for lack of energy, depression, anxiety Endocrine:  Negative for polydipsia, polyuria, symptoms of hypoglycemia (dizziness, hunger, sweating) Hematologic:  Negative for anemia, purpura, petechia, prolonged or excessive bleeding, use of anticoagulants  Allergic:  Negative for difficulty breathing or choking as a result of exposure to anything; no shellfish allergy; no allergic response (rash/itch) to materials, foods  Physical exam: BP (!) 153/72   Pulse 61   Ht 5\' 10"  (1.778 m)   Wt 250 lb (113.4 kg)   BMI 35.87 kg/m  GENERAL APPEARANCE:  Well appearing, well developed, well nourished, NAD  GU: Normal external genitalia DRE: Normal sphincter tone; prostate is approximately 40 g without evidence of nodules or induration  Results: UA demonstrates significant pyuria and moderate bacteria

## 2024-03-05 ENCOUNTER — Ambulatory Visit: Payer: Self-pay | Admitting: Surgery

## 2024-03-05 ENCOUNTER — Encounter: Payer: Self-pay | Admitting: Surgery

## 2024-03-05 VITALS — BP 137/75 | HR 57 | Temp 97.9°F | Ht 70.0 in | Wt 251.6 lb

## 2024-03-05 DIAGNOSIS — D0359 Melanoma in situ of other part of trunk: Secondary | ICD-10-CM

## 2024-03-05 LAB — URINE CULTURE

## 2024-03-05 MED ORDER — OXYCODONE HCL 5 MG PO TABS: 5.0000 mg | ORAL_TABLET | Freq: Four times a day (QID) | ORAL | 0 refills | Status: DC | PRN

## 2024-03-05 NOTE — Patient Instructions (Signed)
 Excision of Skin Lesions, Care After The following information offers guidance on how to care for yourself after your procedure. Your health care provider may also give you more specific instructions. If you have problems or questions, contact your health care provider. What can I expect after the procedure? After your procedure, it is common to have: Soreness or mild pain. Some redness and swelling. Follow these instructions at home: Excision site care  Follow instructions from your health care provider about how to take care of your excision site. Make sure you: Wash your hands with soap and water for at least 20 seconds before and after you change your bandage (dressing). If soap and water are not available, use hand sanitizer. Change your dressing as told by your health care provider. Leave stitches (sutures), skin glue, or adhesive strips in place. These skin closures may need to stay in place for 2 weeks or longer. If adhesive strip edges start to loosen and curl up, you may trim the loose edges. Do not remove adhesive strips completely unless your health care provider tells you to do that. Check the excision area every day for signs of infection. Watch for: More redness, swelling, or pain. Fluid or blood. Warmth. Pus or a bad smell. Keep the site clean, dry, and protected for at least 48 hours. For bleeding, apply gentle but firm pressure to the area using a folded towel for 20 minutes. Do not take baths, swim, or use a hot tub until your health care provider approves. Ask your health care provider if you may take showers. You may only be allowed to take sponge baths. General instructions Take over-the-counter and prescription medicines only as told by your health care provider. Follow instructions from your health care provider about how to minimize scarring. Scarring should lessen over time. Avoid sun exposure until the area has healed. Use sunscreen to protect the area from the sun  after it has healed. Avoid high-impact exercise and activities until the sutures are removed or the area heals. Keep all follow-up visits. This is important. Contact a health care provider if: You have more redness, swelling, or pain around your excision site. You have fluid or blood coming from your excision site. Your excision site feels warm to the touch. You have pus or a bad smell coming from your excision site. You have a fever. You have pain that does not improve in 2-3 days after your procedure. Get help right away if: You have bleeding that does not stop with pressure or a dressing. Your wound opens up. Summary Take over-the-counter and prescription medicines only as told by your health care provider. Change your dressing as told by your health care provider. Contact a health care provider if you have redness, swelling, pain, or other signs of infection around your excision site. Keep all follow-up visits. This is important. This information is not intended to replace advice given to you by your health care provider. Make sure you discuss any questions you have with your health care provider. Document Revised: 07/03/2021 Document Reviewed: 07/03/2021 Elsevier Patient Education  2024 [employer].

## 2024-03-05 NOTE — Progress Notes (Signed)
 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  The patient had an excision of this area on 02/18/24 for a suspected nevus of the skin.  Pathology analysis showed that this is a melanoma in situ, but the margins were positive laterally.  He presents today for excision to obtain clear margins.  The patient denies any issues in the left upper back wound, and reports that he's otherwise been doing well.  Denies any drainage, worsening pain, or redness.  He takes Aspirin 81 mg and he has paused this for today's visit and procedure.  He was started on Bactrim for possible UTI yesterday.    Past Medical History: Past Medical History:  Diagnosis Date   Appendicitis    BPH (benign prostatic hyperplasia)    Emphysema lung (HCC)    Hernia, incisional    Hyperlipidemia    Lung nodules    Rosacea    Sprain    rotator cuff   Wears dentures    full upper and lower     Past Surgical History: Past Surgical History:  Procedure Laterality Date   ABDOMINAL HERNIA REPAIR  2009   APPENDECTOMY     CARDIAC CATHETERIZATION  approx 2000   no issues   CATARACT EXTRACTION W/PHACO Right 01/29/2021   Procedure: CATARACT EXTRACTION PHACO AND INTRAOCULAR LENS PLACEMENT (IOC) RIGHT 3.86 00:30.4;  Surgeon: Nevada Crane, MD;  Location: [facility];  Service: Ophthalmology;  Laterality: Right;   CATARACT EXTRACTION W/PHACO Left 03/19/2021   Procedure: CATARACT EXTRACTION PHACO AND INTRAOCULAR LENS PLACEMENT (IOC) LEFT 2.49 00:24.8;  Surgeon: Nevada Crane, MD;  Location: [facility];  Service: Ophthalmology;  Laterality: Left;   COLONOSCOPY WITH PROPOFOL N/A 10/19/2015   Procedure: COLONOSCOPY WITH PROPOFOL;  Surgeon: Midge Minium, MD;  Location: [facility];  Service: Endoscopy;   Laterality: N/A;   POLYPECTOMY  10/19/2015   Procedure: POLYPECTOMY;  Surgeon: Midge Minium, MD;  Location: [facility];  Service: Endoscopy;;   SHOULDER SURGERY Left 2014, 2015    Home Medications: Prior to Admission medications   Medication Sig Start Date End Date Taking? Authorizing Provider  oxyCODONE (ROXICODONE) 5 MG immediate release tablet Take 1 tablet (5 mg total) by mouth every 6 (six) hours as needed. 03/05/24 03/05/25 Yes Ambur Province, Elita Quick, MD  amLODipine (NORVASC) 5 MG tablet TAKE 1 TABLET(5 MG) BY MOUTH DAILY 08/05/22   Corky Downs, MD  aspirin 81 MG tablet Take 81 mg by mouth daily.    [provider]  atorvastatin (LIPITOR) 80 MG tablet daily 08/05/22   Corky Downs, MD  metoprolol succinate (TOPROL-XL) 100 MG 24 hr tablet TAKE 1 TABLET(100 MG) BY MOUTH DAILY 08/05/22   Corky Downs, MD  metroNIDAZOLE (METROCREAM) 0.75 % cream Apply 1 Application topically 2 (two) times daily. 08/05/22   Corky Downs, MD  silodosin (RAPAFLO) 8 MG CAPS capsule Take 1 capsule (8 mg total) by mouth daily with breakfast. 03/04/24   Joline Maxcy, MD  sulfamethoxazole-trimethoprim (BACTRIM DS) 800-160 MG tablet Take 1 tablet by mouth every 12 (twelve) hours. 03/04/24   Joline Maxcy, MD  tadalafil (CIALIS) 20 MG tablet Take 1 tablet PO Daily PRN 02/18/24   Ziglar, Eli Phillips, MD    Allergies: Allergies  Allergen Reactions  Codeine Other (See Comments)    HA/migraine   Penicillins Other (See Comments)    Elevated blood pressure    Social History:  reports that he quit smoking about 5 years ago. His smoking use included cigarettes. He started smoking about 43 years ago. He has a 38 pack-year smoking history. He has been exposed to tobacco smoke. He has never used smokeless tobacco. He reports current alcohol use. He reports that he does not use drugs.   Family History: Family History  Problem Relation Age of Onset   Breast cancer Mother    Emphysema Father    Heart disease  Father    Heart attack Maternal Grandfather 31   Stroke Paternal Grandmother    Heart disease Paternal Grandfather        Pacemaker   Kidney cancer Neg Hx    Kidney disease Neg Hx    Prostate cancer Neg Hx     Review of Systems: Review of Systems  Constitutional:  Negative for chills and fever.  Respiratory:  Negative for shortness of breath.   Cardiovascular:  Negative for chest pain.  Gastrointestinal:  Negative for nausea and vomiting.  Skin:        Excised area left upper back     Physical Exam BP 137/75   Pulse (!) 57   Temp 97.9 F (36.6 C) (Oral)   Ht 5\' 10"  (1.778 m)   Wt 251 lb 9.6 oz (114.1 kg)   SpO2 97%   BMI 36.10 kg/m  CONSTITUTIONAL: No acute distress HEENT:  Normocephalic, atraumatic, extraocular motion intact.  RESPIRATORY:  Normal respiratory effort without pathologic use of accessory muscles. CARDIOVASCULAR: Regular rhythm and rate. MUSCULOSKELETAL:  Normal muscle strength and tone in all four extremities.  No peripheral edema or cyanosis. SKIN: Left upper back wound with scab, healed, without any erythema or drainage.  No visible melanocytic changes at the outer edges of the wound. NEUROLOGIC:  Motor and sensation is grossly normal.  Cranial nerves are grossly intact. PSYCH:  Alert and oriented to person, place and time. Affect is normal.  Laboratory Analysis: No results found for this or any previous visit (from the past 24 hours).  Imaging: No results found.  Assessment and Plan: This is a 63 y.o. male with newly diagnosed melanoma in situ  --Discussed with the patient that because of the pathology findings, we need to proceed with further excision to clear the margins around the melanoma.  Discussed with him that different stages of melanoma require different clear margin distance, but in his case, with melanoma in situ, he needs 5 mm of clearance.  Discussed with him that for excision of this, we cannot do a circular excision as this would be  difficult to close, but rather an elliptical incision to help the wound edges approximate in a better cosmetic fashion.   --Discussed with the patient today's surgical plan and reviewed the procedure at length including the planned incision, risks, activity restrictions, and he's willing to proceed.     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  Surgeon:  Howie Ill, MD  Anesthesia:  8 ml of 1% lidocaine  Estimated Blood Loss:  15 ml  Specimens:  Left upper back melanoma in situ -- short stitch  superior, long stitch lateral  Complications:  None  Findings:  The patient's prior excision had scabbed and healed over.  For melanoma in situ, 5 mm margins were marked and elliptical incision marked, measuring about 5 cm x 2 cm in size.  Indications for Procedure:  This is a 63 y.o. 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  The risks of bleeding, abscess or infection, injury to surrounding structures, and need for further procedures were all discussed with the patient and he was willing to proceed.  Description of Procedure: The patient was correctly identified at bedside.  The patient was placed in prone position.  Appropriate time-outs were performed.  The patient's left upper back was prepped and draped in usual sterile fashion.  Local anesthetic was infused intradermally.  A 5 x 2 cm elliptical incision was made over the prior melanoma site, with 5-6 mm margins, and scalpel was used to dissect down the skin into the subcutaneous tissue.  Skin flaps were created sharply, and then the specimen was excised intact, with a deep margin of subcutaneous tissue as a precaution.  It was  sent off to pathology.  Further skin flaps were created to allow for better approximation of the skin edges.  The cavity was then irrigated and hemostasis was assured with two 3-0 Vicryl sutures.  The wound was then closed in three layers using 2-0 Vicryl, 3-0 Vicryl and 4-0 Monocryl.  The incision was cleaned and sealed with DermaBond.  The patient tolerated the procedure well and all sharps were appropriately disposed of at the end of the case.  --Patient may shower tomorrow. --May take Tylenol, ibuprofen for pain control.  Will also send a prescription for oxycodone for severe pain. --May resume Aspirin on 03/07/24 --Activity restrictions discussed. --Follow up in one week.   Howie Ill, MD [facility]

## 2024-03-09 LAB — SURGICAL PATHOLOGY

## 2024-03-12 ENCOUNTER — Encounter: Payer: Self-pay | Admitting: Surgery

## 2024-03-12 ENCOUNTER — Ambulatory Visit (INDEPENDENT_AMBULATORY_CARE_PROVIDER_SITE_OTHER): Admitting: Surgery

## 2024-03-12 VITALS — BP 120/69 | HR 71 | Temp 98.0°F | Ht 70.0 in | Wt 248.0 lb

## 2024-03-12 DIAGNOSIS — D0359 Melanoma in situ of other part of trunk: Secondary | ICD-10-CM

## 2024-03-12 DIAGNOSIS — Z09 Encounter for follow-up examination after completed treatment for conditions other than malignant neoplasm: Secondary | ICD-10-CM

## 2024-03-12 NOTE — Patient Instructions (Signed)
 Excision of Skin Lesions, Care After The following information offers guidance on how to care for yourself after your procedure. Your health care provider may also give you more specific instructions. If you have problems or questions, contact your health care provider. What can I expect after the procedure? After your procedure, it is common to have: Soreness or mild pain. Some redness and swelling. Follow these instructions at home: Excision site care  Follow instructions from your health care provider about how to take care of your excision site. Make sure you: Wash your hands with soap and water for at least 20 seconds before and after you change your bandage (dressing). If soap and water are not available, use hand sanitizer. Change your dressing as told by your health care provider. Leave stitches (sutures), skin glue, or adhesive strips in place. These skin closures may need to stay in place for 2 weeks or longer. If adhesive strip edges start to loosen and curl up, you may trim the loose edges. Do not remove adhesive strips completely unless your health care provider tells you to do that. Check the excision area every day for signs of infection. Watch for: More redness, swelling, or pain. Fluid or blood. Warmth. Pus or a bad smell. Keep the site clean, dry, and protected for at least 48 hours. For bleeding, apply gentle but firm pressure to the area using a folded towel for 20 minutes. Do not take baths, swim, or use a hot tub until your health care provider approves. Ask your health care provider if you may take showers. You may only be allowed to take sponge baths. General instructions Take over-the-counter and prescription medicines only as told by your health care provider. Follow instructions from your health care provider about how to minimize scarring. Scarring should lessen over time. Avoid sun exposure until the area has healed. Use sunscreen to protect the area from the sun  after it has healed. Avoid high-impact exercise and activities until the sutures are removed or the area heals. Keep all follow-up visits. This is important. Contact a health care provider if: You have more redness, swelling, or pain around your excision site. You have fluid or blood coming from your excision site. Your excision site feels warm to the touch. You have pus or a bad smell coming from your excision site. You have a fever. You have pain that does not improve in 2-3 days after your procedure. Get help right away if: You have bleeding that does not stop with pressure or a dressing. Your wound opens up. Summary Take over-the-counter and prescription medicines only as told by your health care provider. Change your dressing as told by your health care provider. Contact a health care provider if you have redness, swelling, pain, or other signs of infection around your excision site. Keep all follow-up visits. This is important. This information is not intended to replace advice given to you by your health care provider. Make sure you discuss any questions you have with your health care provider. Document Revised: 07/03/2021 Document Reviewed: 07/03/2021 Elsevier Patient Education  2024 [employer].

## 2024-03-12 NOTE — Progress Notes (Signed)
 03/12/2024  HPI: Cody Matthews is a 63 y.o. male s/p excision of margins for melanoma in situ of the left upper back on 03/05/2024.  Patient presents today for follow-up.  He reports that he has been doing well with exception of a whole-body rash that has developed since the last time I saw him.  He has been started on Bactrim on 03/04/2024 after visiting urology for suspected lower urinary tract infection.  He has since stopped the Bactrim.  Otherwise there are no other new medications that he tried.  Although we did give him a prescription for oxycodone, the patient reports that he never needed it.  Denies any troubles with the incision itself, denies any bruising, drainage, or dehiscence.  Vital signs: BP 120/69   Pulse 71   Temp 98 F (36.7 C) (Oral)   Ht 5\' 10"  (1.778 m)   Wt 248 lb (112.5 kg)   SpO2 100%   BMI 35.58 kg/m    Physical Exam: Constitutional: No acute distress Skin: Left upper back incision is healing well and is clean, dry, intact.  Dermabond has peeled off.  Patient also has widespread macular rash throughout his back, torso, and extremities.  Assessment/Plan: This is a 63 y.o. male s/p excision of margins for melanoma in situ of the left upper back.  - From surgical standpoint, discussed with patient pathology results showed free margins from the excision.  At this point, no further procedures are needed.  However I did recommend that he definitely follow-up with dermatology for continued skin checks. - I will add Bactrim to his list of allergies in his profile. - Follow-up as needed.   Howie Ill, MD [facility]

## 2024-03-13 ENCOUNTER — Encounter: Payer: Self-pay | Admitting: Family Medicine

## 2024-03-15 ENCOUNTER — Encounter: Payer: Self-pay | Admitting: Family Medicine

## 2024-03-15 ENCOUNTER — Ambulatory Visit: Admitting: Family Medicine

## 2024-03-15 VITALS — BP 135/78 | HR 66 | Temp 97.8°F | Resp 18 | Wt 247.0 lb

## 2024-03-15 DIAGNOSIS — Z882 Allergy status to sulfonamides status: Secondary | ICD-10-CM | POA: Insufficient documentation

## 2024-03-15 MED ORDER — PREDNISONE 50 MG PO TABS: ORAL_TABLET | ORAL | 0 refills | Status: DC

## 2024-03-15 NOTE — Assessment & Plan Note (Signed)
 Taking benadryl 50mg  q 8 hours.  Will give short course of prednisone 50 mg daily for 5 days,

## 2024-03-15 NOTE — Progress Notes (Signed)
   Established Patient Office Visit  Subjective   Patient ID: Cody Matthews, male    DOB: 27-Feb-1961  Age: 63 y.o. MRN: [id number]  Chief Complaint  Patient presents with   Rash    Week ago. Neck down    HPI Delightful 63 year old with history of gout, kidney stones, HTN, COPD/emphysema, mixed hyperlipidemia, at bedtime shingles and LUTS (still symptomatic despite tamsulosin 0.8mg  every day).  Now with rash from head to toe and erythematous macular rash started last Thursday.  Took Bactrim double strength for a week prior to Thursday.  Saturday night was the worst with irritation burning sensation.  Has been taking Benadryl 50 mg every 6 for the last 4 days.  No fever, difficulty breathing or wheezing.   ROS    Objective:     BP 135/78 (BP Location: Left Arm, Patient Position: Sitting, Cuff Size: Normal)   Pulse 66   Temp 97.8 F (36.6 C) (Oral)   Resp 18   Wt 247 lb (112 kg)   SpO2 93%   BMI 35.44 kg/m    Physical Exam Vitals and nursing note reviewed.  Constitutional:      Appearance: Normal appearance.  HENT:     Head: Normocephalic and atraumatic.  Eyes:     Conjunctiva/sclera: Conjunctivae normal.  Cardiovascular:     Rate and Rhythm: Normal rate and regular rhythm.  Pulmonary:     Effort: Pulmonary effort is normal.     Breath sounds: Normal breath sounds.  Musculoskeletal:     Right lower leg: No edema.     Left lower leg: No edema.  Skin:    General: Skin is warm and dry.     Findings: Rash (Erythematous macular rash from neck to tops of feet, coalescing on lower extremities.) present.  Neurological:     Mental Status: He is alert and oriented to person, place, and time.  Psychiatric:        Mood and Affect: Mood normal.        Behavior: Behavior normal.        Thought Content: Thought content normal.        Judgment: Judgment normal.          No results found for any visits on 03/15/24.    The ASCVD Risk score (Arnett DK, et al., 2019)  failed to calculate for the following reasons:   The valid total cholesterol range is 130 to 320 mg/dL    Assessment & Plan:  Allergy history, sulfonamide Assessment & Plan: Taking benadryl 50mg  q 8 hours.  Will give short course of prednisone 50 mg daily for 5 days,  Orders: -     predniSONE; One tab PO daily for 5 days.  Dispense: 5 tablet; Refill: 0     Return if symptoms worsen or fail to improve.    Alease Medina, MD

## 2024-04-16 ENCOUNTER — Ambulatory Visit (INDEPENDENT_AMBULATORY_CARE_PROVIDER_SITE_OTHER)

## 2024-04-16 ENCOUNTER — Encounter: Payer: Self-pay | Admitting: Family Medicine

## 2024-04-16 ENCOUNTER — Telehealth: Payer: Self-pay | Admitting: Family Medicine

## 2024-04-16 ENCOUNTER — Other Ambulatory Visit: Payer: Self-pay | Admitting: Family Medicine

## 2024-04-16 DIAGNOSIS — R399 Unspecified symptoms and signs involving the genitourinary system: Secondary | ICD-10-CM | POA: Diagnosis not present

## 2024-04-16 DIAGNOSIS — L719 Rosacea, unspecified: Secondary | ICD-10-CM

## 2024-04-16 MED ORDER — METRONIDAZOLE 0.75 % EX CREA: 1.0000 | TOPICAL_CREAM | Freq: Two times a day (BID) | CUTANEOUS | 3 refills | Status: AC

## 2024-04-16 NOTE — Telephone Encounter (Signed)
 Prescription Request  04/16/2024  LOV: 03/15/2024  What is the name of the medication or equipment? Metrondazole topical cream 0.75  Have you contacted your pharmacy to request a refill? No   Which pharmacy would you like this sent to?  [id number] - [place], [place] - [address] [place] [address] Phone: [phone] Fax: [phone]    Patient notified that their request is being sent to the clinical staff for review and that they should receive a response within 2 business days.   Please advise at Mobile [phone] (mobile)Prescription Request  04/16/2024  LOV: 03/15/2024  What is the name of the medication or equipment? Metronidazole  Topical Cream 0.75  Have you contacted your pharmacy to request a refill? No   Which pharmacy would you like this sent to?  [id number] - [place],  - [address] [place] [address] Phone: [phone] Fax: [phone]    Patient notified that their request is being sent to the clinical staff for review and that they should receive a response within 2 business days.   Please advise at Mobile [phone] (mobile)

## 2024-04-17 LAB — PSA, TOTAL AND FREE
PSA, Free Pct: 18.1 %
PSA, Free: 0.49 ng/mL
Prostate Specific Ag, Serum: 2.7 ng/mL (ref 0.0–4.0)

## 2024-04-17 LAB — SPECIMEN STATUS REPORT

## 2024-04-23 ENCOUNTER — Encounter: Payer: Self-pay | Admitting: Family Medicine

## 2024-04-23 ENCOUNTER — Ambulatory Visit (INDEPENDENT_AMBULATORY_CARE_PROVIDER_SITE_OTHER): Admitting: Family Medicine

## 2024-04-23 VITALS — BP 162/81 | HR 64 | Temp 97.8°F | Resp 18 | Ht 70.0 in | Wt 248.0 lb

## 2024-04-23 DIAGNOSIS — S8391XA Sprain of unspecified site of right knee, initial encounter: Secondary | ICD-10-CM

## 2024-04-23 DIAGNOSIS — S8390XA Sprain of unspecified site of unspecified knee, initial encounter: Secondary | ICD-10-CM | POA: Insufficient documentation

## 2024-04-23 DIAGNOSIS — D039 Melanoma in situ, unspecified: Secondary | ICD-10-CM | POA: Insufficient documentation

## 2024-04-23 NOTE — Progress Notes (Signed)
   Established Patient Office Visit  Subjective   Patient ID: Cody Matthews, male    DOB: [birth year]  Age: 63 y.o. MRN: [id number]  Chief Complaint  Patient presents with   Knee Pain    Knee Pain    Delightful 63 year old with Hx gout, kidney stones, HTN, COPD/emphysema, mixed hyperlipidemia, melanoma in situ, shingles and LUTS. Monday at work he was walking in tall grass and came up on a copperhead.  Scared him and he twisted his knee trying to get away from the snake.  Took ibuprofen , used ice and wrapped his knee in a vinegar towel.  The vinegar wrap was his grandmothers remedy.  Noted the next day he had little swelling and the pain was gone. Needs a note for his employer stating that he can go back to full-time work without restrictions.    ROS    Objective:     BP (!) 162/81 (BP Location: Left Arm, Patient Position: Sitting, Cuff Size: Normal)   Pulse 64   Temp 97.8 F (36.6 C) (Oral)   Resp 18   Ht 5\' 10"  (1.778 m)   Wt 248 lb (112.5 kg)   SpO2 97%   BMI 35.58 kg/m    Physical Exam Neurological:     Motor: Motor function is intact.     Gait: Gait is intact.     Comments: No effusion on either knee joint.  Stable to anterior and posterior stress.  No tenderness across the menisci.  No erythema or warmth          No results found for any visits on 04/23/24.    The ASCVD Risk score (Arnett DK, et al., 2019) failed to calculate for the following reasons:   The valid total cholesterol range is 130 to 320 mg/dL    Assessment & Plan:  Sprain of right knee, unspecified ligament, initial encounter Assessment & Plan: No effusion, erythema or warmth.  Stable to exam.  No edema.  No tenderness to meniscus palpation.      No follow-ups on file.    Rogen Porte K Kashauna Celmer, MD

## 2024-04-23 NOTE — Assessment & Plan Note (Signed)
 No effusion, erythema or warmth.  Stable to exam.  No edema.  No tenderness to meniscus palpation.

## 2024-04-28 NOTE — Progress Notes (Unsigned)
 Assessment: 1. Lower urinary tract symptoms (LUTS)   2. Organic impotence     Plan: I discussed options for management of his lower urinary tract symptoms including continued medical therapy, combination medical therapy, minimally invasive procedures and surgical management (TURP, HoLEP, simple prostatectomy). He would like to continue medical therapy with silodosin  at the present time. I also discussed the diagnosis and management of erectile dysfunction.  Options for management including medical therapy, vacuum erection device, penile injections, intraurethral suppository therapy, and penile prosthesis reviewed. Continue tadalafil  20 mg as needed. Return to office in 3 months.  Chief Complaint:   Chief Complaint  Patient presents with    Lower urinary tract symptoms (LUTS)     History of Present Illness:  Cody Matthews is a 63 y.o. male seen for continued evaluation of LUTS.   He was initally seen by Dr. Del Favia in March 2025.  Patient has long standing BPH/lower urinary tract symptoms and has been on tamsulosin  since 2015.  He noted decreased effectiveness despite doubling up to 0.8 mg daily. IPSS = 21 PVR showed minimal residual  No family history of prostate cancer DRE demonstrates approximate 40 g gland without evidence of nodules or induration  PSA 07/2022= 3.43 Urine cx from 3/25 grew <10K colonies. He was treated with Bactrim  x 10 days. He was started on silodosin  8 mg daily in 3/25.  PSA 5/25:  2.7  He returns today for follow-up.  He continues on silodosin .  He reports some slight improvement in his lower urinary tract symptoms.  He continues with frequency, urgency, intermittent stream.  No dysuria or gross hematuria. IPSS = 17/4. He also reports problems with erectile dysfunction.  He is currently on tadalafil  20 mg as needed.  He is noting decreased effectiveness of this medication as well.  He is on a beta-blocker.  He also reports anejaculation.  Portions of the  above documentation were copied from a prior visit for review purposes only.   Past Medical History:  Past Medical History:  Diagnosis Date   Appendicitis    BPH (benign prostatic hyperplasia)    Emphysema lung (HCC)    Hernia, incisional    Hyperlipidemia    Lung nodules    Rosacea    Sprain    rotator cuff   Wears dentures    full upper and lower    Past Surgical History:  Past Surgical History:  Procedure Laterality Date   ABDOMINAL HERNIA REPAIR  2009   APPENDECTOMY     CARDIAC CATHETERIZATION  approx 2000   no issues   CATARACT EXTRACTION W/PHACO Right 01/29/2021   Procedure: CATARACT EXTRACTION PHACO AND INTRAOCULAR LENS PLACEMENT (IOC) RIGHT 3.86 00:30.4;  Surgeon: Rosa College, MD;  Location: [facility];  Service: Ophthalmology;  Laterality: Right;   CATARACT EXTRACTION W/PHACO Left 03/19/2021   Procedure: CATARACT EXTRACTION PHACO AND INTRAOCULAR LENS PLACEMENT (IOC) LEFT 2.49 00:24.8;  Surgeon: Rosa College, MD;  Location: [facility];  Service: Ophthalmology;  Laterality: Left;   COLONOSCOPY WITH PROPOFOL  N/A 10/19/2015   Procedure: COLONOSCOPY WITH PROPOFOL ;  Surgeon: Marnee Sink, MD;  Location: [facility];  Service: Endoscopy;  Laterality: N/A;   POLYPECTOMY  10/19/2015   Procedure: POLYPECTOMY;  Surgeon: Marnee Sink, MD;  Location: [facility];  Service: Endoscopy;;   SHOULDER SURGERY Left 2014, 2015    Allergies:  Allergies  Allergen Reactions   Codeine Other (See Comments)    HA/migraine   Penicillins Other (See Comments)  Elevated blood pressure   Bactrim  [Sulfamethoxazole -Trimethoprim ] Rash   Sulfa  Antibiotics Rash    Family History:  Family History  Problem Relation Age of Onset   Breast cancer Mother    Emphysema Father    Heart disease Father    Heart attack Maternal Grandfather 50   Stroke Paternal Grandmother    Heart disease Paternal Grandfather        Pacemaker   Kidney cancer Neg Hx     Kidney disease Neg Hx    Prostate cancer Neg Hx     Social History:  Social History   Tobacco Use   Smoking status: Former    Current packs/day: 0.00    Average packs/day: 1 pack/day for 38.0 years (38.0 ttl pk-yrs)    Types: Cigarettes    Start date: 09/05/1980    Quit date: 09/05/2018    Years since quitting: 5.6    Passive exposure: Past   Smokeless tobacco: Never  Vaping Use   Vaping status: Never Used  Substance Use Topics   Alcohol use: Yes    Alcohol/week: 0.0 standard drinks of alcohol    Comment: Drinks 1 beer every few weeks to month   Drug use: No    ROS: Constitutional:  Negative for fever, chills, weight loss CV: Negative for chest pain, previous MI, hypertension Respiratory:  Negative for shortness of breath, wheezing, sleep apnea, frequent cough GI:  Negative for nausea, vomiting, bloody stool, GERD  Physical exam: BP (!) 160/79   Pulse (!) 55   Ht 5\' 10"  (1.778 m)   Wt 250 lb (113.4 kg)   BMI 35.87 kg/m  GENERAL APPEARANCE:  Well appearing, well developed, well nourished, NAD HEENT:  Atraumatic, normocephalic, oropharynx clear NECK:  Supple without lymphadenopathy or thyromegaly ABDOMEN:  Soft, non-tender, no masses EXTREMITIES:  Moves all extremities well, without clubbing, cyanosis, or edema NEUROLOGIC:  Alert and oriented x 3, normal gait, CN II-XII grossly intact MENTAL STATUS:  appropriate BACK:  Non-tender to palpation, No CVAT SKIN:  Warm, dry, and intact  Results: U/A: Negative

## 2024-04-29 ENCOUNTER — Encounter: Payer: Self-pay | Admitting: Urology

## 2024-04-29 ENCOUNTER — Ambulatory Visit: Admitting: Urology

## 2024-04-29 VITALS — BP 160/79 | HR 55 | Ht 70.0 in | Wt 250.0 lb

## 2024-04-29 DIAGNOSIS — R399 Unspecified symptoms and signs involving the genitourinary system: Secondary | ICD-10-CM | POA: Diagnosis not present

## 2024-04-29 DIAGNOSIS — N529 Male erectile dysfunction, unspecified: Secondary | ICD-10-CM

## 2024-04-29 LAB — URINALYSIS, ROUTINE W REFLEX MICROSCOPIC
Bilirubin, UA: NEGATIVE
Glucose, UA: NEGATIVE
Ketones, UA: NEGATIVE
Leukocytes,UA: NEGATIVE
Nitrite, UA: NEGATIVE
Protein,UA: NEGATIVE
RBC, UA: NEGATIVE
Specific Gravity, UA: 1.02 (ref 1.005–1.030)
Urobilinogen, Ur: 0.2 mg/dL (ref 0.2–1.0)
pH, UA: 5.5 (ref 5.0–7.5)

## 2024-05-05 ENCOUNTER — Ambulatory Visit: Payer: 59 | Attending: Internal Medicine | Admitting: Internal Medicine

## 2024-05-05 ENCOUNTER — Encounter: Payer: Self-pay | Admitting: Internal Medicine

## 2024-05-05 VITALS — BP 130/71 | HR 57 | Ht 70.5 in | Wt 250.8 lb

## 2024-05-05 DIAGNOSIS — I10 Essential (primary) hypertension: Secondary | ICD-10-CM | POA: Diagnosis not present

## 2024-05-05 DIAGNOSIS — I2510 Atherosclerotic heart disease of native coronary artery without angina pectoris: Secondary | ICD-10-CM | POA: Diagnosis not present

## 2024-05-05 DIAGNOSIS — I700 Atherosclerosis of aorta: Secondary | ICD-10-CM | POA: Diagnosis not present

## 2024-05-05 DIAGNOSIS — E785 Hyperlipidemia, unspecified: Secondary | ICD-10-CM | POA: Diagnosis not present

## 2024-05-05 MED ORDER — METOPROLOL SUCCINATE ER 100 MG PO TB24: 50.0000 mg | ORAL_TABLET | Freq: Every day | ORAL | 3 refills | Status: DC

## 2024-05-05 MED ORDER — ATORVASTATIN CALCIUM 80 MG PO TABS: 40.0000 mg | ORAL_TABLET | Freq: Every day | ORAL | 3 refills | Status: DC

## 2024-05-05 NOTE — Progress Notes (Signed)
 Cardiology Office Note:  .   Date:  05/05/2024  ID:  Cody Matthews, DOB May 28, 1961, MRN [id number] PCP: Ziglar, Susan K, MD  [facility] Providers Cardiologist:  Sammy Crisp, MD     History of Present Illness: .   Cody Matthews is a 63 y.o. male with history of atypical chest pain and coronary artery calcification, as well as COPD, BPH, and tobacco use, who presents for reevaluation of chest pain.  I met him in 2017 for evaluation of chest pain, with subsequent Myoview  being low risk without evidence of ischemia.  Lung cancer screening chest CTs have shown coronary artery and aortic atherosclerotic calcifications.  He was last seen in our office in 2020.  Today, Cody Matthews reports that he has been feeling fairly well.  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  He denies frank chest pain as well as shortness of breath but has occasional transient feelings in his chest as if his heart does not beat right.  He has not had any edema or lightheadedness.  He notes that his heart rates have been running a bit low at times.  Recent blood pressure at his urologist's office was also somewhat elevated.  He has stopped smoking but continues to chew tobacco.  He is trying to cut down on this.  He walks at least a mile a day at work, sometimes more.  ROS: See HPI  Studies Reviewed: Aaron Aas   EKG Interpretation Date/Time:  Wednesday May 05 2024 09:47:43 EDT Ventricular Rate:  57 PR Interval:  212 QRS Duration:  98 QT Interval:  428 QTC Calculation: 416 R Axis:   45  Text Interpretation: Sinus bradycardia with 1st degree A-V block Abnormal ECG When compared with ECG of 20-Sep-2019 No significant change was found Confirmed by Jefry Lesinski ([id number]) on 05/05/2024 9:51:05 AM    Pharmacologic MPI (12/23/2016): Low risk study with small defect of mild severity at the apex likely due to apical thinning.  No evidence of significant ischemia or scar.  LVEF  55-65%.  Risk Assessment/Calculations:             Physical Exam:   VS:  BP 130/71 (BP Location: Left Arm, Patient Position: Sitting, Cuff Size: Normal)   Pulse (!) 57   Ht 5' 10.5" (1.791 m)   Wt 250 lb 12.8 oz (113.8 kg)   SpO2 97%   BMI 35.48 kg/m    Wt Readings from Last 3 Encounters:  05/05/24 250 lb 12.8 oz (113.8 kg)  04/29/24 250 lb (113.4 kg)  04/23/24 248 lb (112.5 kg)    General:  NAD. Neck: No JVD or HJR. Lungs: Clear to auscultation bilaterally without wheezes or crackles. Heart: Regular rate and rhythm without murmurs, rubs, or gallops. Abdomen: Soft, nontender, nondistended. Extremities: No lower extremity edema.  ASSESSMENT AND PLAN: .    Nonobstructive coronary artery disease, aortic atherosclerosis, and hyperlipidemia: No angina reported.  Previous atypical chest pain seems to have resolved.  Only discomfort he feels is most consistent with transient, self-limited palpitations.  We will plan to continue aspirin and atorvastatin  for secondary prevention, though I will reduce atorvastatin  to 40 mg daily given his quite low LDL on last check.  I will also reduce metoprolol  succinate to 50 mg daily in the setting of mild sinus bradycardia and mild first-degree AV block.  We will need to discuss repeating a lipid panel at follow-up visit in 3 months.  Hypertension:  Blood pressure borderline elevated today.  As above, we will halve metoprolol  succinate to 50 mg daily.  If blood pressure trends up at follow-up, escalation of amlodipine  versus addition of another agent will need to be considered.    Dispo: Return to clinic in 3 months with APP.  Signed, Sammy Crisp, MD

## 2024-05-05 NOTE — Patient Instructions (Signed)
 Medication Instructions:  Your physician recommends the following medication changes.  DECREASE: Atorvastatin  to 40 mg by mouth daily Metoprolol  to 50 mg by mouth daily  *If you need a refill on your cardiac medications before your next appointment, please call your pharmacy*  Lab Work: No labs ordered today   Testing/Procedures: No test ordered today   Follow-Up: At [facility], you and your health needs are our priority.  As part of our continuing mission to provide you with exceptional heart care, our providers are all part of one team.  This team includes your primary Cardiologist (physician) and Advanced Practice Providers or APPs (Physician Assistants and Nurse Practitioners) who all work together to provide you with the care you need, when you need it.  Your next appointment:   3 month(s)  Provider:   You will see one of the following Advanced Practice Providers on your designated Care Team:   Laneta Pintos, NP Gildardo Labrador, PA-C Varney Gentleman, PA-C Cadence Bailey, PA-C Ronald Cockayne, NP Morey Ar, NP

## 2024-07-30 ENCOUNTER — Encounter: Payer: Self-pay | Admitting: Urology

## 2024-07-30 ENCOUNTER — Ambulatory Visit: Admitting: Urology

## 2024-07-30 VITALS — BP 158/77 | HR 66 | Ht 70.0 in | Wt 250.0 lb

## 2024-07-30 DIAGNOSIS — N529 Male erectile dysfunction, unspecified: Secondary | ICD-10-CM | POA: Diagnosis not present

## 2024-07-30 DIAGNOSIS — R399 Unspecified symptoms and signs involving the genitourinary system: Secondary | ICD-10-CM | POA: Diagnosis not present

## 2024-07-30 LAB — URINALYSIS, ROUTINE W REFLEX MICROSCOPIC
Bilirubin, UA: NEGATIVE
Glucose, UA: NEGATIVE
Ketones, UA: NEGATIVE
Leukocytes,UA: NEGATIVE
Nitrite, UA: NEGATIVE
Protein,UA: NEGATIVE
RBC, UA: NEGATIVE
Specific Gravity, UA: <1.005 — ABNORMAL LOW (ref 1.005–1.030)
Urobilinogen, Ur: 0.2 mg/dL (ref 0.2–1.0)
pH, UA: 6 (ref 5.0–7.5)

## 2024-07-30 MED ORDER — VARDENAFIL HCL 20 MG PO TABS: 20.0000 mg | ORAL_TABLET | Freq: Every day | ORAL | 11 refills | Status: AC | PRN

## 2024-07-30 NOTE — Progress Notes (Signed)
 Assessment: 1. Lower urinary tract symptoms (LUTS)   2. Organic impotence     Plan: Continue medical therapy for his BPH with lower urinary tract symptoms with silodosin  8 mg daily. Trial of vardenafil  20 mg as needed for erectile dysfunction.  Prescription and good Rx coupon provided. Return to office in 6 months.  Chief Complaint:   Chief Complaint  Patient presents with   LUTS    History of Present Illness:  Cody Matthews is a 63 y.o. male seen for continued evaluation of LUTS.   He was initally seen by Dr. Shona in March 2025.  Patient has long standing BPH/lower urinary tract symptoms and has been on tamsulosin  since 2015.  He noted decreased effectiveness despite doubling up to 0.8 mg daily. IPSS = 21 PVR showed minimal residual  No family history of prostate cancer DRE demonstrates approximate 40 g gland without evidence of nodules or induration  PSA 07/2022= 3.43 Urine cx from 3/25 grew <10K colonies. He was treated with Bactrim  x 10 days. He was started on silodosin  8 mg daily in 3/25.  PSA 5/25:  2.7  At his visit in May 2025, he continued on silodosin  with some slight improvement in his lower urinary tract symptoms.  He continued with frequency, urgency, intermittent stream.  No dysuria or gross hematuria. IPSS = 17/4. He also reported problems with erectile dysfunction.  He was on tadalafil  20 mg as needed.  He noted decreased effectiveness of this medication as well.  He has been on a beta-blocker.  He also reported anejaculation.  He returns today for follow-up.  He continues on silodosin  daily.  His lower urinary tract symptoms are stable if not slightly improved.  He has noted improvement in his nocturia.  He voids with a good stream and feels like he empties well.  No dysuria or gross hematuria. IPSS = 17/2. He has not used tadalafil  recently.  He does report decreasing effectiveness with the medication previously.  Portions of the above documentation were  copied from a prior visit for review purposes only.   Past Medical History:  Past Medical History:  Diagnosis Date   Appendicitis    BPH (benign prostatic hyperplasia)    Emphysema lung (HCC)    Hernia, incisional    Hyperlipidemia    Lung nodules    Rosacea    Sprain    rotator cuff   Wears dentures    full upper and lower    Past Surgical History:  Past Surgical History:  Procedure Laterality Date   ABDOMINAL HERNIA REPAIR  2009   APPENDECTOMY     CARDIAC CATHETERIZATION  approx 2000   no issues   CATARACT EXTRACTION W/PHACO Right 01/29/2021   Procedure: CATARACT EXTRACTION PHACO AND INTRAOCULAR LENS PLACEMENT (IOC) RIGHT 3.86 00:30.4;  Surgeon: Myrna Adine Anes, MD;  Location: [facility];  Service: Ophthalmology;  Laterality: Right;   CATARACT EXTRACTION W/PHACO Left 03/19/2021   Procedure: CATARACT EXTRACTION PHACO AND INTRAOCULAR LENS PLACEMENT (IOC) LEFT 2.49 00:24.8;  Surgeon: Myrna Adine Anes, MD;  Location: [facility];  Service: Ophthalmology;  Laterality: Left;   COLONOSCOPY WITH PROPOFOL  N/A 10/19/2015   Procedure: COLONOSCOPY WITH PROPOFOL ;  Surgeon: Rogelia Copping, MD;  Location: [facility];  Service: Endoscopy;  Laterality: N/A;   POLYPECTOMY  10/19/2015   Procedure: POLYPECTOMY;  Surgeon: Rogelia Copping, MD;  Location: [facility];  Service: Endoscopy;;   SHOULDER SURGERY Left 2014, 2015    Allergies:  Allergies  Allergen Reactions   Codeine Other (See Comments)    HA/migraine   Penicillins Other (See Comments)    Elevated blood pressure   Bactrim  [Sulfamethoxazole -Trimethoprim ] Rash   Sulfa  Antibiotics Rash    Family History:  Family History  Problem Relation Age of Onset   Breast cancer Mother    Emphysema Father    Heart disease Father    Heart attack Maternal Grandfather 46   Stroke Paternal Grandmother    Heart disease Paternal Grandfather        Pacemaker   Kidney cancer Neg Hx    Kidney disease Neg Hx     Prostate cancer Neg Hx     Social History:  Social History   Tobacco Use   Smoking status: Former    Current packs/day: 0.00    Average packs/day: 1 pack/day for 38.0 years (38.0 ttl pk-yrs)    Types: Cigarettes    Start date: 09/05/1980    Quit date: 09/05/2018    Years since quitting: 5.9    Passive exposure: Past   Smokeless tobacco: Current    Types: Chew   Tobacco comments:    Chewing tabacco  Vaping Use   Vaping status: Never Used  Substance Use Topics   Alcohol use: Yes    Alcohol/week: 0.0 standard drinks of alcohol    Comment: Drinks 1 beer every few weeks to month   Drug use: No    ROS: Constitutional:  Negative for fever, chills, weight loss CV: Negative for chest pain, previous MI, hypertension Respiratory:  Negative for shortness of breath, wheezing, sleep apnea, frequent cough GI:  Negative for nausea, vomiting, bloody stool, GERD  Physical exam: BP (!) 158/77   Pulse 66   Ht 5' 10 (1.778 m)   Wt 250 lb (113.4 kg)   BMI 35.87 kg/m  GENERAL APPEARANCE:  Well appearing, well developed, well nourished, NAD HEENT:  Atraumatic, normocephalic, oropharynx clear NECK:  Supple without lymphadenopathy or thyromegaly ABDOMEN:  Soft, non-tender, no masses EXTREMITIES:  Moves all extremities well, without clubbing, cyanosis, or edema NEUROLOGIC:  Alert and oriented x 3, normal gait, CN II-XII grossly intact MENTAL STATUS:  appropriate BACK:  Non-tender to palpation, No CVAT SKIN:  Warm, dry, and intact  Results: U/A: Negative

## 2024-08-06 ENCOUNTER — Encounter: Payer: Self-pay | Admitting: Nurse Practitioner

## 2024-08-06 ENCOUNTER — Ambulatory Visit: Attending: Nurse Practitioner | Admitting: Physician Assistant

## 2024-08-06 VITALS — BP 130/68 | HR 65 | Ht 70.0 in | Wt 249.0 lb

## 2024-08-06 DIAGNOSIS — I700 Atherosclerosis of aorta: Secondary | ICD-10-CM

## 2024-08-06 DIAGNOSIS — I2510 Atherosclerotic heart disease of native coronary artery without angina pectoris: Secondary | ICD-10-CM | POA: Diagnosis not present

## 2024-08-06 DIAGNOSIS — E785 Hyperlipidemia, unspecified: Secondary | ICD-10-CM | POA: Diagnosis not present

## 2024-08-06 DIAGNOSIS — Z79899 Other long term (current) drug therapy: Secondary | ICD-10-CM

## 2024-08-06 DIAGNOSIS — I10 Essential (primary) hypertension: Secondary | ICD-10-CM | POA: Diagnosis not present

## 2024-08-06 NOTE — Patient Instructions (Signed)
 Medication Instructions:  Your physician recommends that you continue on your current medications as directed. Please refer to the Current Medication list given to you today.   *If you need a refill on your cardiac medications before your next appointment, please call your pharmacy*  Lab Work: Your provider would like for you to have following labs drawn today lipid panel.   If you have labs (blood work) drawn today and your tests are completely normal, you will receive your results only by: MyChart Message (if you have MyChart) OR A paper copy in the mail If you have any lab test that is abnormal or we need to change your treatment, we will call you to review the results.  Follow-Up: At [facility], you and your health needs are our priority.  As part of our continuing mission to provide you with exceptional heart care, our providers are all part of one team.  This team includes your primary Cardiologist (physician) and Advanced Practice Providers or APPs (Physician Assistants and Nurse Practitioners) who all work together to provide you with the care you need, when you need it.  Your next appointment:   1 year(s)  Provider:   You may see Lonni Hanson, MD or Lesley Maffucci, PA-C

## 2024-08-06 NOTE — Progress Notes (Signed)
 Cardiology Office Note    Date:  08/06/2024   ID:  Cody Matthews, DOB May 06, 1961, MRN [id number]  PCP:  Ziglar, Susan K, MD  Cardiologist:  Lonni Hanson, MD  Electrophysiologist:  None   Chief Complaint: Follow-up  History of Present Illness:   Cody Matthews is a 63 y.o. male with history of aortic atherosclerosis, atypical chest pain and coronary artery calcification, COPD, BPH, hypertension, and tobacco use who presents for follow-up on coronary artery calcifications and hypertension.    Patient was initially evaluated in our clinic 10/2016 for abnormal stress test which unfortunately was not available for review.  Titration of antianginal medication was recommended.  Coronary artery calcification was identified on lung cancer screening CT.  Myocardial perfusion stress test 12/2016 was reviewed and low risk without evidence of ischemia.   Patient was most recently seen by Dr. Hanson 04/2024 for evaluation of atypical chest pain.  He reported occasional transient feelings in his chest as if his heart does not beat right. His metoprolol  was reduced in the setting of bradycardia and mild first degree AV block. His atorvastatin  was also reduced as his prior LDL was quite low.   Patient presents today overall doing well from a cardiac perspective.  He monitors his blood pressure at home and reports good control over the past few months.  He also reports that his pulse has improved with reduction in his metoprolol .  He denies any further symptoms of chest discomfort.  He is without symptoms of angina or cardiac decompensation.  He denies chest pain, shortness of breath, lightheadedness, dizziness, palpitations, and lower extremity swelling.  Labs independently reviewed: 01/2024 Hgb 15.5, HCT 45.3, platelets 226, BUN 11, creatinine 0.86, sodium 136, potassium 4.2, normal LFTs, normal TSH/T4, TC 78, TG 142, HDL 27, LDL 26  Objective   Past Medical History:  Diagnosis Date   Appendicitis     BPH (benign prostatic hyperplasia)    Emphysema lung (HCC)    Hernia, incisional    Hyperlipidemia    Lung nodules    Rosacea    Sprain    rotator cuff   Wears dentures    full upper and lower    Current Medications: Current Meds  Medication Sig   amLODipine  (NORVASC ) 5 MG tablet TAKE 1 TABLET(5 MG) BY MOUTH DAILY   aspirin 81 MG tablet Take 81 mg by mouth daily.   atorvastatin  (LIPITOR) 80 MG tablet Take 0.5 tablets (40 mg total) by mouth daily.   metoprolol  succinate (TOPROL -XL) 100 MG 24 hr tablet Take 0.5 tablets (50 mg total) by mouth daily.   metroNIDAZOLE  (METROCREAM ) 0.75 % cream Apply 1 Application topically 2 (two) times daily.   silodosin  (RAPAFLO ) 8 MG CAPS capsule Take 1 capsule (8 mg total) by mouth daily with breakfast.   vardenafil  (LEVITRA ) 20 MG tablet Take 1 tablet (20 mg total) by mouth daily as needed for erectile dysfunction.    Allergies:   Codeine, Penicillins, Bactrim  [sulfamethoxazole -trimethoprim ], and Sulfa  antibiotics   Social History   Socioeconomic History   Marital status: Married    Spouse name: Not on file   Number of children: Not on file   Years of education: Not on file   Highest education level: Not on file  Occupational History   Not on file  Tobacco Use   Smoking status: Former    Current packs/day: 0.00    Average packs/day: 1 pack/day for 38.0 years (38.0 ttl pk-yrs)    Types: Cigarettes  Start date: 09/05/1980    Quit date: 09/05/2018    Years since quitting: 5.9    Passive exposure: Past   Smokeless tobacco: Current    Types: Chew   Tobacco comments:    Chewing tabacco  Vaping Use   Vaping status: Never Used  Substance and Sexual Activity   Alcohol use: Yes    Alcohol/week: 0.0 standard drinks of alcohol    Comment: Drinks 1 beer every few weeks to month   Drug use: No   Sexual activity: Not on file  Other Topics Concern   Not on file  Social History Narrative   Not on file   Social Drivers of Health    Financial Resource Strain: Patient Declined (03/15/2024)   Overall Financial Resource Strain (CARDIA)    Difficulty of Paying Living Expenses: Patient declined  Food Insecurity: Patient Declined (03/15/2024)   Hunger Vital Sign    Worried About Running Out of Food in the Last Year: Patient declined    Ran Out of Food in the Last Year: Patient declined  Transportation Needs: Patient Declined (03/15/2024)   PRAPARE - Administrator, Civil Service (Medical): Patient declined    Lack of Transportation (Non-Medical): Patient declined  Physical Activity: Sufficiently Active (03/15/2024)   Exercise Vital Sign    Days of Exercise per Week: 7 days    Minutes of Exercise per Session: 80 min  Stress: No Stress Concern Present (03/15/2024)   [employer] of Occupational Health - Occupational Stress Questionnaire    Feeling of Stress : Not at all  Social Connections: Unknown (03/15/2024)   Social Connection and Isolation Panel    Frequency of Communication with Friends and Family: Patient declined    Frequency of Social Gatherings with Friends and Family: Patient declined    Attends Religious Services: Patient declined    Database administrator or Organizations: Patient declined    Attends Engineer, structural: Not on file    Marital Status: Patient declined     Family History:  The patient's family history includes Breast cancer in his mother; Emphysema in his father; Heart attack (age of onset: 14) in his maternal grandfather; Heart disease in his father and paternal grandfather; Stroke in his paternal grandmother. There is no history of Kidney cancer, Kidney disease, or Prostate cancer.  ROS:   12-point review of systems is negative unless otherwise noted in the HPI.  EKGs/Other Studies Reviewed:    Studies reviewed were summarized above. The additional studies were reviewed today:  12/2016 Lexiscan  myoview  Defect 1: There is a small defect of mild severity present  in the apex location. This is likely due to apical thinning artifact. The study is normal. This is a low risk study. The left ventricular ejection fraction is normal (55-65%). There was no ST segment deviation noted during stress.  EKG:  EKG personally reviewed by me today    PHYSICAL EXAM:    VS:  BP 130/68 (BP Location: Left Arm, Patient Position: Sitting, Cuff Size: Normal)   Pulse 65   Ht 5' 10 (1.778 m)   Wt 249 lb (112.9 kg)   SpO2 96%   BMI 35.73 kg/m   BMI: Body mass index is 35.73 kg/m.  GEN: Well nourished, well developed in no acute distress NECK: No JVD; No carotid bruits CARDIAC: RRR, no murmurs, rubs, gallops RESPIRATORY:  Clear to auscultation without rales, wheezing or rhonchi  ABDOMEN: Soft, non-tender, non-distended EXTREMITIES: No edema; No deformity  Wt  Readings from Last 3 Encounters:  08/06/24 249 lb (112.9 kg)  07/30/24 250 lb (113.4 kg)  05/05/24 250 lb 12.8 oz (113.8 kg)        ASSESSMENT & PLAN:   Nonobstructive CAD - Coronary artery calcifications noted on chest CT.  Negative stress testing in 2018.  He is without symptoms of angina and cardiac decompensation.  He is continued on aspirin and statin therapy.  Aortic atherosclerosis Hyperlipidemia - Most recent lipid panel 01/2024 with LDL 26.  Atorvastatin  was reduced at last visit to 40 mg daily, which he is continued on.  Will recheck lipid panel today.  Hypertension - Blood pressure well-controlled today and per home readings.  He is continued metoprolol  succinate 50 mg daily and amlodipine  5 mg daily.    Disposition: F/u with Dr. Mady or an APP in 1 year.   Medication Adjustments/Labs and Tests Ordered: Current medicines are reviewed at length with the patient today.  Concerns regarding medicines are outlined above. Medication changes, Labs and Tests ordered today are summarized above and listed in the Patient Instructions accessible in Encounters.   Bonney Lesley Maffucci,  PA-C 08/06/2024 11:30 AM     [facility] [place] [address] [place], [place] [address] [phone]

## 2024-08-07 LAB — LIPID PANEL
Chol/HDL Ratio: 3.5 ratio (ref 0.0–5.0)
Cholesterol, Total: 84 mg/dL — ABNORMAL LOW (ref 100–199)
HDL: 24 mg/dL — ABNORMAL LOW (ref >39)
LDL Chol Calc (NIH): 37 mg/dL (ref 0–99)
Triglycerides: 124 mg/dL (ref 0–149)
VLDL Cholesterol Cal: 23 mg/dL (ref 5–40)

## 2024-08-09 ENCOUNTER — Ambulatory Visit: Payer: Self-pay | Admitting: Physician Assistant

## 2024-08-09 NOTE — Telephone Encounter (Signed)
 Reviewed results and recommendations with patient. He verbalized understanding with no questions.

## 2024-08-19 DIAGNOSIS — L218 Other seborrheic dermatitis: Secondary | ICD-10-CM | POA: Diagnosis not present

## 2024-08-19 DIAGNOSIS — L2089 Other atopic dermatitis: Secondary | ICD-10-CM | POA: Diagnosis not present

## 2024-08-19 DIAGNOSIS — D225 Melanocytic nevi of trunk: Secondary | ICD-10-CM | POA: Diagnosis not present

## 2024-09-09 ENCOUNTER — Telehealth: Payer: Self-pay | Admitting: Internal Medicine

## 2024-09-09 MED ORDER — AMLODIPINE BESYLATE 5 MG PO TABS: ORAL_TABLET | ORAL | 3 refills | Status: AC

## 2024-09-09 NOTE — Telephone Encounter (Signed)
*  STAT* If patient is at the pharmacy, call can be transferred to refill team.   1. Which medications need to be refilled? (please list name of each medication and dose if known)   amLODipine  (NORVASC ) 5 MG tablet     2. Would you like to learn more about the convenience, safety, & potential cost savings by using the [facility]? No   3. Are you open to using the [facility] (Type [facility]. No    4. Which pharmacy/location (including street and city if local pharmacy) is medication to be sent to? [id number] - [place], [place] - [address]    5. Do they need a 30 day or 90 day supply? 30 day

## 2024-09-09 NOTE — Telephone Encounter (Signed)
 RX sent in

## 2024-09-30 ENCOUNTER — Telehealth: Payer: Self-pay | Admitting: Internal Medicine

## 2024-09-30 DIAGNOSIS — L2089 Other atopic dermatitis: Secondary | ICD-10-CM | POA: Diagnosis not present

## 2024-09-30 DIAGNOSIS — L218 Other seborrheic dermatitis: Secondary | ICD-10-CM | POA: Diagnosis not present

## 2024-09-30 MED ORDER — METOPROLOL SUCCINATE ER 50 MG PO TB24: 50.0000 mg | ORAL_TABLET | Freq: Every day | ORAL | 3 refills | Status: AC

## 2024-09-30 MED ORDER — ATORVASTATIN CALCIUM 40 MG PO TABS: 40.0000 mg | ORAL_TABLET | Freq: Every day | ORAL | 3 refills | Status: AC

## 2024-09-30 MED ORDER — METOPROLOL SUCCINATE ER 100 MG PO TB24: 50.0000 mg | ORAL_TABLET | Freq: Every day | ORAL | 3 refills | Status: DC

## 2024-09-30 NOTE — Telephone Encounter (Signed)
 Pt called to see if a new prescription can be called in for 50mg  tablets instead of 100mg  tablets that we just sent to pharm today for him since he called requesting a refill. The note states he's to take 50mg  of the 100mg  tablets so he been cutting them in half but now he doesn't want to cut them in half since he said someone told him he can actually get a 50mg  tablet of the metoprolol  succinate (TOPROL -XL) 100 MG 24 hr tablet. Camellia has already sent it in the correct way but pt called stating he doesn't want it that way, he wants the 50mg  tablet not the 100mg  tablet. Does he just need to continue to cut them in half or is he able to get a 50mg  tablets sent to pharmacy. Once confirmed, pt would like a callback with an update. Please advise

## 2024-09-30 NOTE — Telephone Encounter (Signed)
 Spoke with the patient, who stated he is currently taking half of his metoprolol  tablet (total 50 mg) and half of his atorvastatin  tablet (total 40 mg). The patient is requesting prescriptions for metoprolol  50 mg and atorvastatin  40 mg tablets to avoid having to cut his medication in half.  Pt made aware updated prescription would be sent to pharmacy as requested

## 2024-09-30 NOTE — Addendum Note (Signed)
 Addended by: DESIDERIO RUSSELL SAILOR on: 09/30/2024 01:44 PM   Modules accepted: Orders

## 2024-09-30 NOTE — Telephone Encounter (Signed)
 RX sent in

## 2024-09-30 NOTE — Telephone Encounter (Signed)
*  STAT* If patient is at the pharmacy, call can be transferred to refill team.   1. Which medications need to be refilled? (please list name of each medication and dose if known)   metoprolol  succinate (TOPROL -XL) 100 MG 24 hr tablet   2. Would you like to learn more about the convenience, safety, & potential cost savings by using the [facility]?   3. Are you open to using the [facility] ([facility]. ).  4. Which pharmacy/location (including street and city if local pharmacy) is medication to be sent to?  [id number] - [place], [place] - [address]   5. Do they need a 30 day or 90 day supply?   30 day  Patient stated he still has some medication left.

## 2024-10-01 ENCOUNTER — Inpatient Hospital Stay: Admission: RE | Admit: 2024-10-01 | Source: Ambulatory Visit

## 2024-10-07 ENCOUNTER — Ambulatory Visit
Admission: RE | Admit: 2024-10-07 | Discharge: 2024-10-07 | Disposition: A | Discharge status: Home / Self Care | Source: Ambulatory Visit | Attending: Acute Care | Admitting: Acute Care

## 2024-10-07 DIAGNOSIS — Z87891 Personal history of nicotine dependence: Secondary | ICD-10-CM

## 2024-10-07 DIAGNOSIS — Z122 Encounter for screening for malignant neoplasm of respiratory organs: Secondary | ICD-10-CM

## 2024-10-12 ENCOUNTER — Other Ambulatory Visit: Payer: Self-pay

## 2024-10-12 DIAGNOSIS — Z87891 Personal history of nicotine dependence: Secondary | ICD-10-CM

## 2024-10-12 DIAGNOSIS — Z122 Encounter for screening for malignant neoplasm of respiratory organs: Secondary | ICD-10-CM

## 2024-10-13 ENCOUNTER — Encounter: Payer: Self-pay | Admitting: Family Medicine

## 2025-01-31 ENCOUNTER — Ambulatory Visit: Admitting: Urology
